# Patient Record
Sex: Female | Born: 1935 | Race: White | Hispanic: No | Smoking: Never smoker
Health system: Southern US, Community
[De-identification: ages and names within clinical notes are randomized; demographics above are authoritative.]

## PROBLEM LIST (undated history)

## (undated) DIAGNOSIS — F419 Anxiety disorder, unspecified: Secondary | ICD-10-CM

## (undated) DIAGNOSIS — I480 Paroxysmal atrial fibrillation: Secondary | ICD-10-CM

## (undated) DIAGNOSIS — B351 Tinea unguium: Secondary | ICD-10-CM

## (undated) DIAGNOSIS — S065XAA Traumatic subdural hemorrhage with loss of consciousness status unknown, initial encounter: Secondary | ICD-10-CM

## (undated) DIAGNOSIS — I10 Essential (primary) hypertension: Secondary | ICD-10-CM

## (undated) DIAGNOSIS — L03119 Cellulitis of unspecified part of limb: Secondary | ICD-10-CM

## (undated) DIAGNOSIS — E785 Hyperlipidemia, unspecified: Secondary | ICD-10-CM

## (undated) DIAGNOSIS — C4490 Unspecified malignant neoplasm of skin, unspecified: Secondary | ICD-10-CM

## (undated) DIAGNOSIS — R55 Syncope and collapse: Secondary | ICD-10-CM

## (undated) DIAGNOSIS — E559 Vitamin D deficiency, unspecified: Secondary | ICD-10-CM

## (undated) DIAGNOSIS — G4700 Insomnia, unspecified: Secondary | ICD-10-CM

## (undated) DIAGNOSIS — K219 Gastro-esophageal reflux disease without esophagitis: Secondary | ICD-10-CM

## (undated) DIAGNOSIS — F329 Major depressive disorder, single episode, unspecified: Secondary | ICD-10-CM

## (undated) DIAGNOSIS — Z9289 Personal history of other medical treatment: Secondary | ICD-10-CM

## (undated) DIAGNOSIS — J45909 Unspecified asthma, uncomplicated: Secondary | ICD-10-CM

## (undated) DIAGNOSIS — J449 Chronic obstructive pulmonary disease, unspecified: Secondary | ICD-10-CM

## (undated) DIAGNOSIS — Z8673 Personal history of transient ischemic attack (TIA), and cerebral infarction without residual deficits: Secondary | ICD-10-CM

## (undated) DIAGNOSIS — F32A Depression, unspecified: Secondary | ICD-10-CM

## (undated) DIAGNOSIS — Z9889 Other specified postprocedural states: Secondary | ICD-10-CM

## (undated) DIAGNOSIS — S065X9A Traumatic subdural hemorrhage with loss of consciousness of unspecified duration, initial encounter: Secondary | ICD-10-CM

## (undated) HISTORY — PX: OTHER SURGICAL HISTORY: SHX169

---

## 2005-11-17 ENCOUNTER — Ambulatory Visit: Payer: Self-pay | Admitting: Internal Medicine

## 2005-11-21 ENCOUNTER — Ambulatory Visit: Payer: Self-pay | Admitting: Internal Medicine

## 2006-09-19 ENCOUNTER — Ambulatory Visit: Payer: Self-pay | Admitting: Internal Medicine

## 2007-01-29 ENCOUNTER — Ambulatory Visit: Payer: Self-pay | Admitting: Internal Medicine

## 2007-02-14 ENCOUNTER — Ambulatory Visit: Payer: Self-pay | Admitting: Internal Medicine

## 2008-02-24 ENCOUNTER — Ambulatory Visit: Payer: Self-pay | Admitting: Internal Medicine

## 2009-10-21 ENCOUNTER — Inpatient Hospital Stay: Payer: Self-pay

## 2012-06-27 ENCOUNTER — Emergency Department: Payer: Self-pay | Admitting: Emergency Medicine

## 2013-04-12 ENCOUNTER — Emergency Department: Payer: Self-pay | Admitting: Emergency Medicine

## 2014-09-29 ENCOUNTER — Other Ambulatory Visit: Payer: Self-pay | Admitting: Internal Medicine

## 2014-09-29 DIAGNOSIS — R1310 Dysphagia, unspecified: Secondary | ICD-10-CM

## 2014-10-07 ENCOUNTER — Ambulatory Visit
Admission: RE | Admit: 2014-10-07 | Discharge: 2014-10-07 | Disposition: A | Discharge status: Home / Self Care | Payer: PPO | Source: Ambulatory Visit | Attending: Internal Medicine | Admitting: Internal Medicine

## 2014-10-07 DIAGNOSIS — R1310 Dysphagia, unspecified: Secondary | ICD-10-CM | POA: Diagnosis present

## 2014-10-07 DIAGNOSIS — K228 Other specified diseases of esophagus: Secondary | ICD-10-CM | POA: Insufficient documentation

## 2014-10-21 ENCOUNTER — Ambulatory Visit: Payer: Self-pay | Admitting: Speech Pathology

## 2014-12-02 ENCOUNTER — Ambulatory Visit: Payer: PPO | Admitting: Physical Therapy

## 2014-12-17 ENCOUNTER — Other Ambulatory Visit: Payer: Self-pay | Admitting: Neurology

## 2014-12-17 DIAGNOSIS — I6200 Nontraumatic subdural hemorrhage, unspecified: Secondary | ICD-10-CM

## 2014-12-17 DIAGNOSIS — I609 Nontraumatic subarachnoid hemorrhage, unspecified: Secondary | ICD-10-CM

## 2014-12-24 ENCOUNTER — Ambulatory Visit
Admission: RE | Admit: 2014-12-24 | Discharge: 2014-12-24 | Disposition: A | Discharge status: Home / Self Care | Payer: PPO | Source: Ambulatory Visit | Attending: Neurology | Admitting: Neurology

## 2014-12-24 DIAGNOSIS — I63312 Cerebral infarction due to thrombosis of left middle cerebral artery: Secondary | ICD-10-CM | POA: Diagnosis not present

## 2014-12-24 DIAGNOSIS — I609 Nontraumatic subarachnoid hemorrhage, unspecified: Secondary | ICD-10-CM

## 2014-12-24 DIAGNOSIS — I6200 Nontraumatic subdural hemorrhage, unspecified: Secondary | ICD-10-CM | POA: Diagnosis present

## 2014-12-24 DIAGNOSIS — J329 Chronic sinusitis, unspecified: Secondary | ICD-10-CM | POA: Insufficient documentation

## 2015-01-02 ENCOUNTER — Ambulatory Visit
Admission: EM | Admit: 2015-01-02 | Discharge: 2015-01-02 | Disposition: A | Discharge status: Home / Self Care | Payer: PPO | Attending: Emergency Medicine | Admitting: Emergency Medicine

## 2015-01-02 DIAGNOSIS — Z8673 Personal history of transient ischemic attack (TIA), and cerebral infarction without residual deficits: Secondary | ICD-10-CM | POA: Diagnosis not present

## 2015-01-02 DIAGNOSIS — N3946 Mixed incontinence: Secondary | ICD-10-CM | POA: Diagnosis not present

## 2015-01-02 DIAGNOSIS — J309 Allergic rhinitis, unspecified: Secondary | ICD-10-CM | POA: Insufficient documentation

## 2015-01-02 DIAGNOSIS — N390 Urinary tract infection, site not specified: Secondary | ICD-10-CM | POA: Insufficient documentation

## 2015-01-02 DIAGNOSIS — Z79899 Other long term (current) drug therapy: Secondary | ICD-10-CM | POA: Insufficient documentation

## 2015-01-02 DIAGNOSIS — N3281 Overactive bladder: Secondary | ICD-10-CM | POA: Insufficient documentation

## 2015-01-02 DIAGNOSIS — J449 Chronic obstructive pulmonary disease, unspecified: Secondary | ICD-10-CM | POA: Insufficient documentation

## 2015-01-02 HISTORY — DX: Traumatic subdural hemorrhage with loss of consciousness status unknown, initial encounter: S06.5XAA

## 2015-01-02 HISTORY — DX: Chronic obstructive pulmonary disease, unspecified: J44.9

## 2015-01-02 HISTORY — DX: Traumatic subdural hemorrhage with loss of consciousness of unspecified duration, initial encounter: S06.5X9A

## 2015-01-02 LAB — URINALYSIS COMPLETE WITH MICROSCOPIC (ARMC ONLY)
Bilirubin Urine: NEGATIVE
Glucose, UA: NEGATIVE mg/dL
Ketones, ur: NEGATIVE mg/dL
Nitrite: POSITIVE — AB
PH: 5.5 (ref 5.0–8.0)
SPECIFIC GRAVITY, URINE: 1.015 (ref 1.005–1.030)

## 2015-01-02 MED ORDER — CEFUROXIME AXETIL 250 MG PO TABS: 250.0000 mg | ORAL_TABLET | Freq: Two times a day (BID) | ORAL | Status: DC

## 2015-01-02 MED ORDER — PHENAZOPYRIDINE HCL 100 MG PO TABS: 200.0000 mg | ORAL_TABLET | Freq: Three times a day (TID) | ORAL | Status: DC | PRN

## 2015-01-02 NOTE — Discharge Instructions (Signed)
Overactive Bladder The bladder has two functions that are totally opposite of the other. One is to relax and stretch out so it can store urine (fills like a balloon), and the other is to contract and squeeze down so that it can empty the urine that it has stored. Proper functioning of the bladder is a complex mixing of these two functions. The filling and emptying of the bladder can be influenced by:  The bladder.  The spinal cord.  The brain.  The nerves going to the bladder.  Other organs that are closely related to the bladder such as prostate in males and the vagina in females. As your bladder fills with urine, nerve signals are sent from the bladder to the brain to tell you that you may need to urinate. Normal urination requires that the bladder squeeze down with sufficient strength to empty the bladder, but this also requires that the bladder squeeze down sufficiently long to finish the job. In addition the sphincter muscles, which normally keep you from leaking urine, must also relax so that the urine can pass. Coordination between the bladder muscle squeezing down and the sphincter muscles relaxing is required to make everything happen normally. With an overactive bladder sometimes the muscles of the bladder contract unexpectedly and involuntarily and this causes an urgent need to urinate. The normal response is to try to hold urine in by contracting the sphincter muscles. Sometimes the bladder contracts so strongly that the sphincter muscles cannot stop the urine from passing out and incontinence occurs. This kind of incontinence is called urge incontinence. Having an overactive bladder can be embarrassing and awkward. It can keep you from living life the way you want to. Many people think it is just something you have to put up with as you grow older or have certain health conditions. In fact, there are treatments that can help make your life easier and more pleasant. CAUSES  Many things  can cause an overactive bladder. Possibilities include:  Urinary tract infection or infection of nearby tissues such as the prostate.  Prostate enlargement.  In women, multiple pregnancies or surgery on the uterus or urethra.  Bladder stones, inflammation, or tumors.  Caffeine.  Alcohol.  Medications. For example, diuretics (drugs that help the body get rid of extra fluid) increase urine production. Some other medicines must be taken with lots of fluids.  Muscle or nerve weakness. This might be the result of a spinal cord injury, a stroke, multiple sclerosis, or Parkinson disease.  Diabetes can cause a high urine volume which fills the bladder so quickly that the normal urge to urinate is triggered very strongly. SYMPTOMS   Loss of bladder control. You feel the need to urinate and cannot make your body wait.  Sudden, strong urges to urinate.  Urinating 8 or more times a day.  Waking up to urinate two or more times a night. DIAGNOSIS  To decide if you have overactive bladder, your health care provider will probably:  Ask about symptoms you have noticed.  Ask about your overall health. This will include questions about any medications you are taking.  Do a physical examination. This will help determine if there are obvious blockages or other problems.  Order some tests. These might include:  A blood test to check for diabetes or other health issues that could be contributing to the problem.  Urine testing. This could measure the flow of urine and the pressure on the bladder.  A test of your neurological  system (the brain, spinal cord, and nerves). This is the system that senses the need to urinate. Some of these tests are called flow tests, bladder pressure tests, and electrical measurements of the sphincter muscle.  A bladder test to check whether it is emptying completely when you urinate.  Cystoscopy. This test uses a thin tube with a tiny camera on it. It offers a  look inside your urethra and bladder to see if there are problems.  Imaging tests. You might be given a contrast dye and then asked to urinate. X-rays are taken to see how your bladder is working. TREATMENT  An overactive bladder can be treated in many ways. The treatment will depend on the cause. Whether you have a mild or severe case also makes a difference. Often, treatment can be given in your health care provider's office or clinic. Be sure to discuss the different options with your caregiver. They include:  Behavioral treatments. These do not involve medication or surgery:  Bladder training. For this, you would follow a schedule to urinate at regular intervals. This helps you learn to control the urge to urinate. At first, you might be asked to wait a few minutes after feeling the urge. In time, you should be able to schedule bathroom visits an hour or more apart.  Kegel exercises. These exercises strengthen the pelvic floor muscles, which support the bladder. Toning these muscles can help control urination even if the bladder muscles are overactive. A specialist will teach you how to do these exercises correctly. They will require daily practice.  Weight loss. If you are obese or overweight, losing weight might stop your bladder from being overactive. Talk to your health care provider about how many pounds you should lose. Also ask if there is a specific program or method that would work best for you.  Diet change. This might be suggested if constipation is making your overactive bladder worse. Your health care provider or a nutritionist can explain ways to change what you eat to ease constipation. Other people might need to take in less caffeine or alcohol. Sometimes drinking fewer fluids is needed, too.  Protection. This is not an actual treatment. But, you could wear special pads to take care of any leakage while you wait for other treatments to take effect. This will help you avoid  embarrassment.  Physical treatments.  Electrical stimulation. Electrodes will send gentle pulses to the nerves or muscles that help control the bladder. The goal is to strengthen them. Sometimes this is done with the electrodes outside the body. Or, they might be placed inside the body (implanted). This treatment can take several months to have an effect.  Medications. These are usually used along with other treatments. Several medicines are available. Some are injected into the muscles involved in urination. Others come in pill form. Medications sometimes prescribed include:  Anticholinergics. These drugs block the signals that the nerves deliver to the bladder. This keeps it from releasing urine at the wrong time. Researchers think the drugs might help in other ways, too.  Imipramine. This is an antidepressant. But, it relaxes bladder muscles.  Botox. This is still experimental. Some people believe that injecting it into the bladder muscles will relax them so they work more normally. It has also been injected into the sphincter muscle when the sphincter muscle does not open properly. This is a temporary fix, however. Also, it might make matters worse, especially in older people.  Surgery.  A device might be implanted  to help manage your nerves. It works on the nerves that signal when you need to urinate.  Surgery is sometimes needed with electrical stimulation. If the electrodes are implanted, this is done through surgery.  Sometimes repairs need to be made through surgery. For example, the size of the bladder can be changed. This is usually done in severe cases only. HOME CARE INSTRUCTIONS   Take any medications your health care provider prescribed or suggested. Follow the directions carefully.  Practice any lifestyle changes that are recommended. These might include:  Drinking less fluid or drinking at different times of the day. If you need to urinate often during the night, for  example, you may need to stop drinking fluids early in the evening.  Cutting down on caffeine or alcohol. They can both make an overactive bladder worse. Caffeine is found in coffee, tea, and sodas.  Doing Kegel exercises to strengthen muscles.  Losing weight, if that is recommended.  Eating a healthy and balanced diet. This will help you avoid constipation.  Keep a journal or a log. You might be asked to record how much you drink and when, and also when you feel the need to urinate.  Learn how to care for implants or other devices, such as pessaries. SEEK MEDICAL CARE IF:   Your overactive bladder gets worse.  You feel increased pain or irritation when you urinate.  You notice blood in your urine.  You have questions about any medications or devices that your health care provider recommended.  You notice blood, pus, or swelling at the site of any test or treatment procedure.  You have an oral temperature above 102F (38.9C). SEEK IMMEDIATE MEDICAL CARE IF:  You have an oral temperature above 102F (38.9C), not controlled by medicine. Document Released: 03/18/2009 Document Revised: 10/06/2013 Document Reviewed: 03/18/2009 [facility] Patient Information [address], [place]. This information is not intended to replace advice given to you by your health care provider. Make sure you discuss any questions you have with your health care provider. Kegel Exercises The goal of Kegel exercises is to isolate and exercise your pelvic floor muscles. These muscles act as a hammock that supports the rectum, vagina, small intestine, and uterus. As the muscles weaken, the hammock sags and these organs are displaced from their normal positions. Kegel exercises can strengthen your pelvic floor muscles and help you to improve bladder and bowel control, improve sexual response, and help reduce many problems and some discomfort during pregnancy. Kegel exercises can be done anywhere and at any time. HOW TO  PERFORM Summers your pelvic floor muscles. To do this, squeeze (contract) the muscles that you use when you try to stop the flow of urine. You will feel a tightness in the vaginal area (women) and a tight lift in the rectal area (men and women).  When you begin, contract your pelvic muscles tight for 2-5 seconds, then relax them for 2-5 seconds. This is one set. Do 4-5 sets with a short pause in between.  Contract your pelvic muscles for 8-10 seconds, then relax them for 8-10 seconds. Do 4-5 sets. If you cannot contract your pelvic muscles for 8-10 seconds, try 5-7 seconds and work your way up to 8-10 seconds. Your goal is 4-5 sets of 10 contractions each day. Keep your stomach, buttocks, and legs relaxed during the exercises. Perform sets of both short and long contractions. Vary your positions. Perform these contractions 3-4 times per day. Perform sets while you are:  Lying in bed in the morning.  Standing at lunch.  Sitting in the late afternoon.  Lying in bed at night. You should do 40-50 contractions per day. Do not perform more Kegel exercises per day than recommended. Overexercising can cause muscle fatigue. Continue these exercises for for at least 15-20 weeks or as directed by your caregiver. Document Released: 05/08/2012 Document Reviewed: 05/08/2012 [facility] Patient Information 2015 [place]. This information is not intended to replace advice given to you by your health care provider. Make sure you discuss any questions you have with your health care provider. Urinary Incontinence Urinary incontinence is the involuntary loss of urine from your bladder. CAUSES  There are many causes of urinary incontinence. They include:  Medicines.  Infections.  Prostatic enlargement, leading to overflow of urine from your bladder.  Surgery.  Neurological diseases.  Emotional factors. SIGNS AND SYMPTOMS Urinary Incontinence can be divided into four  types:  Urge incontinence. Urge incontinence is the involuntary loss of urine before you have the opportunity to go to the bathroom. There is a sudden urge to void but not enough time to reach a bathroom.  Stress incontinence. Stress incontinence is the sudden loss of urine with any activity that forces urine to pass. It is commonly caused by anatomical changes to the pelvis and sphincter areas of your body.  Overflow incontinence. Overflow incontinence is the loss of urine from an obstructed opening to your bladder. This results in a backup of urine and a resultant buildup of pressure within the bladder. When the pressure within the bladder exceeds the closing pressure of the sphincter, the urine overflows, which causes incontinence, similar to water overflowing a dam.  Total incontinence. Total incontinence is the loss of urine as a result of the inability to store urine within your bladder. DIAGNOSIS  Evaluating the cause of incontinence may require:  A thorough and complete medical and obstetric history.  A complete physical exam.  Laboratory tests such as a urine culture and sensitivities. When additional tests are indicated, they can include:  An ultrasound exam.  Kidney and bladder X-rays.  Cystoscopy. This is an exam of the bladder using a narrow scope.  Urodynamic testing to test the nerve function to the bladder and sphincter areas. TREATMENT  Treatment for urinary incontinence depends on the cause:  For urge incontinence caused by a bacterial infection, antibiotics will be prescribed. If the urge incontinence is related to medicines you take, your health care provider may have you change the medicine.  For stress incontinence, surgery to re-establish anatomical support to the bladder or sphincter, or both, will often correct the condition.  For overflow incontinence caused by an enlarged prostate, an operation to open the channel through the enlarged prostate will allow  the flow of urine out of the bladder. In women with fibroids, a hysterectomy may be recommended.  For total incontinence, surgery on your urinary sphincter may help. An artificial urinary sphincter (an inflatable cuff placed around the urethra) may be required. In women who have developed a hole-like passage between their bladder and vagina (vesicovaginal fistula), surgery to close the fistula often is required. HOME CARE INSTRUCTIONS  Normal daily hygiene and the use of pads or adult diapers that are changed regularly will help prevent odors and skin damage.  Avoid caffeine. It can overstimulate your bladder.  Use the bathroom regularly. Try about every 2-3 hours to go to the bathroom, even if you do not feel the need to do so. Take time  to empty your bladder completely. After urinating, wait a minute. Then try to urinate again.  For causes involving nerve dysfunction, keep a log of the medicines you take and a journal of the times you go to the bathroom. SEEK MEDICAL CARE IF:  You experience worsening of pain instead of improvement in pain after your procedure.  Your incontinence becomes worse instead of better. SEE IMMEDIATE MEDICAL CARE IF:  You experience fever or shaking chills.  You are unable to pass your urine.  You have redness spreading into your groin or down into your thighs. MAKE SURE YOU:   Understand these instructions.   Will watch your condition.  Will get help right away if you are not doing well or get worse. Document Released: 06/29/2004 Document Revised: 03/12/2013 Document Reviewed: 10/29/2012 [facility] Patient Information 2015 [place], [place]. This information is not intended to replace advice given to you by your health care provider. Make sure you discuss any questions you have with your health care provider. Urinary Tract Infection Urinary tract infections (UTIs) can develop anywhere along your urinary tract. Your urinary tract is your body's drainage  system for removing wastes and extra water. Your urinary tract includes two kidneys, two ureters, a bladder, and a urethra. Your kidneys are a pair of bean-shaped organs. Each kidney is about the size of your fist. They are located below your ribs, one on each side of your spine. CAUSES Infections are caused by microbes, which are microscopic organisms, including fungi, viruses, and bacteria. These organisms are so small that they can only be seen through a microscope. Bacteria are the microbes that most commonly cause UTIs. SYMPTOMS  Symptoms of UTIs may vary by age and gender of the patient and by the location of the infection. Symptoms in young women typically include a frequent and intense urge to urinate and a painful, burning feeling in the bladder or urethra during urination. Older women and men are more likely to be tired, shaky, and weak and have muscle aches and abdominal pain. A fever may mean the infection is in your kidneys. Other symptoms of a kidney infection include pain in your back or sides below the ribs, nausea, and vomiting. DIAGNOSIS To diagnose a UTI, your caregiver will ask you about your symptoms. Your caregiver also will ask to provide a urine sample. The urine sample will be tested for bacteria and white blood cells. White blood cells are made by your body to help fight infection. TREATMENT  Typically, UTIs can be treated with medication. Because most UTIs are caused by a bacterial infection, they usually can be treated with the use of antibiotics. The choice of antibiotic and length of treatment depend on your symptoms and the type of bacteria causing your infection. HOME CARE INSTRUCTIONS  If you were prescribed antibiotics, take them exactly as your caregiver instructs you. Finish the medication even if you feel better after you have only taken some of the medication.  Drink enough water and fluids to keep your urine clear or pale yellow.  Avoid caffeine, tea, and  carbonated beverages. They tend to irritate your bladder.  Empty your bladder often. Avoid holding urine for long periods of time.  Empty your bladder before and after sexual intercourse.  After a bowel movement, women should cleanse from front to back. Use each tissue only once. SEEK MEDICAL CARE IF:   You have back pain.  You develop a fever.  Your symptoms do not begin to resolve within 3 days. SEEK  IMMEDIATE MEDICAL CARE IF:   You have severe back pain or lower abdominal pain.  You develop chills.  You have nausea or vomiting.  You have continued burning or discomfort with urination. MAKE SURE YOU:   Understand these instructions.  Will watch your condition.  Will get help right away if you are not doing well or get worse. Document Released: 03/01/2005 Document Revised: 11/21/2011 Document Reviewed: 06/30/2011 [facility] Patient Information 2015 [place], [place]. This information is not intended to replace advice given to you by your health care provider. Make sure you discuss any questions you have with your health care provider.

## 2015-01-02 NOTE — ED Provider Notes (Addendum)
CSN: [id number]     Arrival date & time 01/02/15  1417 History   First MD Initiated Contact with Patient 01/02/15 1440     Chief Complaint  Patient presents with  . Urinary Tract Infection   (Consider location/radiation/quality/duration/timing/severity/associated sxs/prior Treatment) HPI Comments: Single caucasian female elderly here with two daughters for evaluation of urinary frequency, urgency, incontinence, burning, slight confusion (mental) repeating self or unsure if took medication today, decreased amounts of urine, cloudy and has odor.  Patient has been wearing urinary pads or diapers since stroke 11 Aug 2014.  Fell at home and suffered subdural hematoma and subarachnoid bleed after stroke and had follow up head CT scan with PCM recently also normal no active bleeding.  Patient has some dysphasia since stroke unable to always say word she wants to e.g. Dog's name but can describe her, etc.  Her oxybutynin was stopped after her stroke and urinary frequency worsened.  Daughters report mother doesn't always change diaper or pad immediately if soiled due to frugalness "grew up during depression era".  Voiding every 2 hours at night past 2-3 days.  Patient uses walker and currently living with a daughter.  Seasonal allergies acting up too recently restarted on claritin 10mg  po daily.  Daughters noticed cough nonproductive worst at night.  The history is provided by the patient, a relative and a caregiver.    Past Medical History  Diagnosis Date  . Stroke   . COPD (chronic obstructive pulmonary disease)   . Subdural hematoma    History reviewed. No pertinent past surgical history. History reviewed. No pertinent family history. History  Substance Use Topics  . Smoking status: Never Smoker   . Smokeless tobacco: Never Used  . Alcohol Use: Yes     Comment: occasional   OB History    No data available     Review of Systems  Constitutional: Negative for fever, chills, diaphoresis,  activity change, appetite change, fatigue and unexpected weight change.  HENT: Positive for congestion, postnasal drip and rhinorrhea. Negative for dental problem, drooling, ear discharge, ear pain, facial swelling, hearing loss, mouth sores, nosebleeds, sinus pressure, sore throat, tinnitus, trouble swallowing and voice change.   Eyes: Negative for photophobia, pain, discharge, redness, itching and visual disturbance.  Respiratory: Positive for cough. Negative for choking, chest tightness, shortness of breath, wheezing and stridor.   Cardiovascular: Negative for chest pain, palpitations and leg swelling.  Gastrointestinal: Negative for nausea, vomiting, abdominal pain, diarrhea, constipation, blood in stool and abdominal distention.  Endocrine: Negative for cold intolerance and heat intolerance.  Genitourinary: Positive for dysuria, urgency, frequency and decreased urine volume. Negative for hematuria, flank pain, vaginal bleeding, vaginal discharge, enuresis, difficulty urinating, genital sores, vaginal pain, menstrual problem, pelvic pain and dyspareunia.  Musculoskeletal: Negative for myalgias, back pain, joint swelling, arthralgias, gait problem, neck pain and neck stiffness.  Allergic/Immunologic: Positive for environmental allergies. Negative for food allergies.  Neurological: Positive for speech difficulty. Negative for dizziness, tremors, seizures, syncope, facial asymmetry, weakness, light-headedness, numbness and headaches.  Hematological: Negative for adenopathy. Does not bruise/bleed easily.  Psychiatric/Behavioral: Positive for confusion and sleep disturbance. Negative for hallucinations, behavioral problems, self-injury and agitation. The patient is not nervous/anxious and is not hyperactive.     Allergies  Review of patient's allergies indicates no known allergies.  Home Medications   Prior to Admission medications   Medication Sig Start Date End Date Taking? Authorizing Provider   ALPRAZolam Duanne Moron) 0.25 MG tablet Take 0.25 mg by mouth at bedtime as  needed for anxiety.   Yes Historical Provider, MD  atorvastatin (LIPITOR) 40 MG tablet Take 40 mg by mouth daily.   Yes Historical Provider, MD  loratadine (CLARITIN) 10 MG tablet Take 10 mg by mouth daily.   Yes Historical Provider, MD  metoprolol succinate (TOPROL-XL) 25 MG 24 hr tablet Take 25 mg by mouth daily.   Yes Historical Provider, MD  omeprazole (PRILOSEC) 20 MG capsule Take 20 mg by mouth daily.   Yes Historical Provider, MD  prednisoLONE 5 MG TABS tablet Take by mouth.   Yes Historical Provider, MD  cefUROXime (CEFTIN) 250 MG tablet Take 1 tablet (250 mg total) by mouth 2 (two) times daily with a meal. 01/02/15   Olen Cordial, NP  phenazopyridine (PYRIDIUM) 100 MG tablet Take 2 tablets (200 mg total) by mouth 3 (three) times daily as needed for pain. 01/02/15   Aura Fey Vanshika Jastrzebski, NP   BP 128/57 mmHg  Pulse 89  Temp(Src) 97 F (36.1 C) (Tympanic)  Ht 5\' 2"  (1.575 m)  Wt 98 lb (44.453 kg)  BMI 17.92 kg/m2  SpO2 100% Physical Exam  Constitutional: She is oriented to person, place, and time. Vital signs are normal. She appears well-developed and well-nourished. No distress.  HENT:  Head: Normocephalic and atraumatic.  Right Ear: Hearing, tympanic membrane, external ear and ear canal normal.  Left Ear: Hearing, tympanic membrane, external ear and ear canal normal.  Nose: Mucosal edema present. No rhinorrhea, nose lacerations, sinus tenderness or nasal deformity. No epistaxis.  No foreign bodies. Right sinus exhibits no maxillary sinus tenderness and no frontal sinus tenderness. Left sinus exhibits no maxillary sinus tenderness and no frontal sinus tenderness.  Mouth/Throat: Uvula is midline and mucous membranes are normal. Mucous membranes are not pale, not dry and not cyanotic. She does not have dentures. No oral lesions. No trismus in the jaw. Normal dentition. No dental abscesses, uvula swelling, lacerations  or dental caries. Posterior oropharyngeal edema and posterior oropharyngeal erythema present. No oropharyngeal exudate or tonsillar abscesses.  Yellow white cerumen occluding bilateral external canals unable to visualize TMs; cobblestoning posterior pharynx  Eyes: Conjunctivae, EOM and lids are normal. Pupils are equal, round, and reactive to light. Right eye exhibits no discharge. Left eye exhibits no discharge. No scleral icterus.  Neck: Trachea normal and normal range of motion. Neck supple. No tracheal deviation present. No thyromegaly present.  Cardiovascular: Normal rate, regular rhythm, normal heart sounds and intact distal pulses.  Exam reveals no gallop and no friction rub.   No murmur heard. Pulmonary/Chest: Effort normal and breath sounds normal. No respiratory distress. She has no wheezes. She has no rales. She exhibits no tenderness.  Abdominal: Soft. Bowel sounds are normal. She exhibits no shifting dullness, no distension, no pulsatile liver, no fluid wave, no abdominal bruit, no ascites, no pulsatile midline mass and no mass. There is no hepatosplenomegaly. There is no tenderness. There is no rigidity, no rebound, no guarding, no CVA tenderness, no tenderness at McBurney's point and negative Murphy's sign. No hernia. Hernia confirmed negative in the ventral area.  Dull to percussion x 4 quads  Musculoskeletal: Normal range of motion. She exhibits no edema or tenderness.  Lymphadenopathy:    She has no cervical adenopathy.  Neurological: She is alert and oriented to person, place, and time. She has normal reflexes. She exhibits normal muscle tone. Coordination normal.  Skin: Skin is warm, dry and intact. No rash noted. She is not diaphoretic. No erythema. No pallor.  Psychiatric:  She has a normal mood and affect. Her speech is normal and behavior is normal. Judgment and thought content normal. Cognition and memory are normal.  Nursing note and vitals reviewed.  Patient gait slow and  steady in exam room and hall with walker to bathroom and return with daughter walking next to her.  Speech clear, answers appropriate but looks to daughters for validation of medication reconciliation and date/time last dose taken.  ED Course  Procedures (including critical care time) Labs Review Labs Reviewed  URINALYSIS COMPLETEWITH MICROSCOPIC ([facility] ONLY) - Abnormal; Notable for the following:    APPearance CLOUDY (*)    Hgb urine dipstick TRACE (*)    Protein, ur TRACE (*)    Nitrite POSITIVE (*)    Leukocytes, UA 2+ (*)    Bacteria, UA MANY (*)    Squamous Epithelial / LPF 0-5 (*)    All other components within normal limits  URINE CULTURE    Imaging Review No results found.   MDM   1. UTI (lower urinary tract infection)   2. Mixed incontinence   3. Overactive bladder   4. History of stroke   5. Allergic rhinitis, unspecified allergic rhinitis type    Medications as directed.  Patient is also to push fluids and may use Pyridium 100mg  po TID prn x 2 days.  Follow up with [facility] for re-evaluation of incontinence and overactive bladder.  Trial kegel exercises handout given to patient.  Discussed voiding on regular schedule/bladder training e.g. Hourly and increase to every 2 hours while awake.  Do not wear wet urinary pads or diapers as can cause recurrent UTIs.  This is her second this year.  Previous urine culture resistant to floxins, bactrim.  See epic care everywhere.  Culture pending in 48 hours. Given copy of urinalysis results today and discussed with daughters and patient.  Avoiding macrobid in geriatric patient.  Has previously tolerated ceftin 250mg  po BID x 7 days.  Discussed prilosec can decrease efficacy of ceftin hold her prilosec dose this week unless heartburn symptoms and discussed may need to complete longer course of ceftin 10 days given.  Discussed oxybutynin not recommended with dementia/stroke and why PCM probably discontinued medication.  If no improvement with  medications over next 48 hours contact me to discuss changing antibiotics.  If hematuria gross, back/flank pain, confusion worsening, lethargy, fever, unable to void every 8 hours follow up sooner at ER as patient may require IV antibiotics or further imaging/tests.   Call or return to clinic as needed if these symptoms worsen or fail to improve as anticipated.  Exitcare handout on cystitis, overactive bladder, kegels exercises, urinary incontinence given to patient and daughters.  Daughters and Patient verbalized agreement and understanding of treatment plan and had no further questions at this time. P2:  Hydrate and cranberry juice  Patient may use normal saline nasal spray as needed.  Continue claritin 10mg  po daily. On prednisone daily discussed would not start flonase or nasacort because of this.  Avoid triggers if possible.  Shower prior to bedtime if exposed to triggers.  If allergic dust/dust mites recommend mattress/pillow covers/encasements; washing linens, vacuuming, sweeping, dusting weekly.  Call or return to clinic as needed if these symptoms worsen or fail to improve as anticipated.   Exitcare handout on allergic rhinitis given to patient.  Patient and daughters verbalized understanding of instructions, agreed with plan of care and had no further questions at this time.  P2:  Avoidance and hand washing.  Otila Kluver  Erling Conte, NP 01/03/15 1501  Spoke with brother in law.  Patient doing better on medication symptoms decreased.  Discussed urine culture e.coli sensitive to ceftin.  Patient to take all medication as prescribed and follow up if symptoms do not completely resolve with PCM or [place].  Family member verbalized understanding of information/instructions, agreed with plan of care and had no further questions at this time.  Olen Cordial, NP 01/08/15 [phone]

## 2015-01-02 NOTE — ED Notes (Signed)
UTI symptoms last 2-3 days. Having more urgency and burning.

## 2015-01-06 LAB — URINE CULTURE: Culture: >=100000

## 2015-04-04 ENCOUNTER — Emergency Department: Payer: PPO

## 2015-04-04 ENCOUNTER — Encounter: Payer: Self-pay | Admitting: Emergency Medicine

## 2015-04-04 ENCOUNTER — Observation Stay
Admission: EM | Admit: 2015-04-04 | Discharge: 2015-04-06 | Disposition: A | Discharge status: Skilled Nursing Facility | Payer: PPO | Attending: Internal Medicine | Admitting: Internal Medicine

## 2015-04-04 DIAGNOSIS — S32592A Other specified fracture of left pubis, initial encounter for closed fracture: Secondary | ICD-10-CM | POA: Diagnosis present

## 2015-04-04 DIAGNOSIS — M25552 Pain in left hip: Secondary | ICD-10-CM | POA: Diagnosis not present

## 2015-04-04 DIAGNOSIS — Z79899 Other long term (current) drug therapy: Secondary | ICD-10-CM | POA: Insufficient documentation

## 2015-04-04 DIAGNOSIS — A419 Sepsis, unspecified organism: Secondary | ICD-10-CM | POA: Diagnosis not present

## 2015-04-04 DIAGNOSIS — S51811A Laceration without foreign body of right forearm, initial encounter: Secondary | ICD-10-CM

## 2015-04-04 DIAGNOSIS — R531 Weakness: Secondary | ICD-10-CM | POA: Insufficient documentation

## 2015-04-04 DIAGNOSIS — M8580 Other specified disorders of bone density and structure, unspecified site: Secondary | ICD-10-CM | POA: Diagnosis not present

## 2015-04-04 DIAGNOSIS — Z888 Allergy status to other drugs, medicaments and biological substances status: Secondary | ICD-10-CM | POA: Diagnosis not present

## 2015-04-04 DIAGNOSIS — J449 Chronic obstructive pulmonary disease, unspecified: Secondary | ICD-10-CM | POA: Insufficient documentation

## 2015-04-04 DIAGNOSIS — N390 Urinary tract infection, site not specified: Secondary | ICD-10-CM | POA: Diagnosis not present

## 2015-04-04 DIAGNOSIS — K5790 Diverticulosis of intestine, part unspecified, without perforation or abscess without bleeding: Secondary | ICD-10-CM | POA: Insufficient documentation

## 2015-04-04 DIAGNOSIS — Z8679 Personal history of other diseases of the circulatory system: Secondary | ICD-10-CM | POA: Diagnosis not present

## 2015-04-04 DIAGNOSIS — M5136 Other intervertebral disc degeneration, lumbar region: Secondary | ICD-10-CM | POA: Diagnosis not present

## 2015-04-04 DIAGNOSIS — Z8673 Personal history of transient ischemic attack (TIA), and cerebral infarction without residual deficits: Secondary | ICD-10-CM | POA: Insufficient documentation

## 2015-04-04 DIAGNOSIS — S81811A Laceration without foreign body, right lower leg, initial encounter: Principal | ICD-10-CM | POA: Insufficient documentation

## 2015-04-04 DIAGNOSIS — W19XXXA Unspecified fall, initial encounter: Secondary | ICD-10-CM | POA: Diagnosis not present

## 2015-04-04 DIAGNOSIS — Z8249 Family history of ischemic heart disease and other diseases of the circulatory system: Secondary | ICD-10-CM | POA: Diagnosis not present

## 2015-04-04 DIAGNOSIS — S51801A Unspecified open wound of right forearm, initial encounter: Secondary | ICD-10-CM | POA: Diagnosis present

## 2015-04-04 DIAGNOSIS — R000 Tachycardia, unspecified: Secondary | ICD-10-CM | POA: Insufficient documentation

## 2015-04-04 DIAGNOSIS — S32502A Unspecified fracture of left pubis, initial encounter for closed fracture: Secondary | ICD-10-CM | POA: Insufficient documentation

## 2015-04-04 DIAGNOSIS — Z833 Family history of diabetes mellitus: Secondary | ICD-10-CM | POA: Diagnosis not present

## 2015-04-04 MED ORDER — TETANUS-DIPHTH-ACELL PERTUSSIS 5-2.5-18.5 LF-MCG/0.5 IM SUSP
0.5000 mL | Freq: Once | INTRAMUSCULAR | Status: AC
  Administered 2015-04-04: 0.5 mL via INTRAMUSCULAR
  Filled 2015-04-04: qty 0.5

## 2015-04-04 MED ORDER — MORPHINE SULFATE (PF) 2 MG/ML IV SOLN
2.0000 mg | Freq: Once | INTRAVENOUS | Status: DC
  Filled 2015-04-04: qty 1

## 2015-04-04 MED ORDER — LIDOCAINE HCL (PF) 1 % IJ SOLN
INTRAMUSCULAR | Status: AC
  Filled 2015-04-04: qty 10

## 2015-04-04 MED ORDER — ONDANSETRON HCL 4 MG/2ML IJ SOLN
4.0000 mg | Freq: Once | INTRAMUSCULAR | Status: AC
  Administered 2015-04-05: 4 mg via INTRAVENOUS
  Filled 2015-04-04: qty 2

## 2015-04-04 MED ORDER — TRAMADOL HCL 50 MG PO TABS: ORAL_TABLET | ORAL | Status: DC

## 2015-04-04 MED ORDER — ACETAMINOPHEN 325 MG PO TABS: 650.0000 mg | ORAL_TABLET | Freq: Once | ORAL | Status: DC

## 2015-04-04 MED ORDER — TRAMADOL HCL 50 MG PO TABS
100.0000 mg | ORAL_TABLET | Freq: Once | ORAL | Status: AC
  Administered 2015-04-04: 100 mg via ORAL
  Filled 2015-04-04: qty 2

## 2015-04-04 MED ORDER — DOCUSATE SODIUM 100 MG PO CAPS: ORAL_CAPSULE | ORAL | Status: DC

## 2015-04-04 MED ORDER — SODIUM CHLORIDE 0.9 % IV BOLUS (SEPSIS)
1000.0000 mL | Freq: Once | INTRAVENOUS | Status: AC
  Administered 2015-04-05: 1000 mL via INTRAVENOUS

## 2015-04-04 NOTE — Discharge Instructions (Signed)
You have been seen in the Emergency Department (ED) today for a skin tear to your right forearm and right lower leg.  Please try to avoid getting the Steri-Strips on your right arm when it and they will follow off on their own in about a week.  Similarly, please try to avoid getting the Steri-Strips on your right lower leg wet.  It is important that they stay in place until they fall off on their own.  In addition, as we discussed, you have subtle fractures of your pelvic from your fall.  These injuries are managed non-operatively with pain medication.  Please call Dr. Nicholaus Bloom office in the morning to set up a follow up appointment for about one week.  Please take Tylenol (acetaminophen) or Motrin (ibuprofen) as needed for discomfort as written on the box.   Please follow up with your doctor as soon as possible regarding today's emergent visit.   Return to the ED for re-evaluation or call your doctor if you notice any signs of infection such as fever, increased pain, increased redness, pus, or other symptoms that concern you.   Deep Skin Avulsion A deep skin avulsion is a type of open wound. It often results from a severe injury (trauma) that tears away all layers of the skin or an entire body part. The areas of the body that are most often affected by a deep skin avulsion include the face, lips, ears, nose, and fingers. A deep skin avulsion may make structures below the skin become visible. You may be able to see muscle, bone, nerves, and blood vessels. A deep skin avulsion can also damage important structures beneath the skin. These include tendons, ligaments, nerves, or blood vessels. CAUSES Injuries that often cause a deep skin avulsion include:  Being crushed.  Falling against a jagged surface.  Animal bites.  Gunshot wounds.  Severe burns.  Injuries that involve being dragged, such as bicycle or motorcycle accidents. SYMPTOMS Symptoms of a deep skin avulsion  include:  Pain.  Numbness.  Swelling.  A misshapen body part.  Bleeding, which may be heavy.  Fluid leaking from the wound. DIAGNOSIS This condition may be diagnosed with a medical history and physical exam. You may also have X-rays done. TREATMENT The treatment that is chosen for a deep skin avulsion depends on how large and deep the wound is and where it is located. Treatment for all types of avulsions usually starts with:  Controlling the bleeding.  Washing out the wound with a germ-free (sterile) salt-water solution.  Removing dead tissue from the wound. A wound may be closed or left open to heal. This depends on the size and location of the wound and whether it is likely to become infected. Wounds are usually covered or closed if they expose blood vessels, nerves, bone, or cartilage.  Wounds that are small and clean may be closed with stitches (sutures).  Wounds that cannot be closed with sutures may be covered with a piece of skin (graft) or a skin flap. Skin may be taken from on or near the wound, from another part of the body, or from a donor.  Wounds may be left open if they are hard to close or they may become infected. These wounds heal over time from the bottom up. You may also receive medicine. This may include:  Antibiotics.  A tetanus shot.  Rabies vaccine. HOME CARE INSTRUCTIONS Medicines  Take or apply over-the-counter and prescription medicines only as told by your health care provider.  If you were prescribed an antibiotic, take or apply it as told by your health care provider. Do not stop taking the antibiotic even if your condition improves.  You may get anti-itch medicine while your wound is healing. Use it only as told by your health care provider. Wound Care  There are many ways to close and cover a wound. For example, a wound can be covered with sutures, skin glue, or adhesive strips. Follow instructions from your health care provider  about:  How to take care of your wound.  When and how you should change your bandage (dressing).  When you should remove your dressing.  Removing whatever was used to close your wound.  Keep the dressing dry as told by your health care provider. Do not take baths, swim, use a hot tub, or do anything that would put your wound underwater until your health care provider approves.  Clean the wound each day or as told by your health care provider.  Wash the wound with mild soap and water.  Rinse the wound with water to remove all soap.  Pat the wound dry with a clean towel. Do not rub it.  Do not scratch or pick at the wound.  Check your wound every day for signs of infection. Watch for:  Redness, swelling, or pain.  Fluid, blood, or pus. General Instructions  Raise (elevate) the injured area above the level of your heart while you are sitting or lying down.  Keep all follow-up visits as told by your health care provider. This is important. SEEK MEDICAL CARE IF:  You received a tetanus shot and you have swelling, severe pain, redness, or bleeding at the injection site.  You have a fever.  Your pain is not controlled with medicine.  You have increased redness, swelling, or pain at the site of your wound.  You have fluid, blood, or pus coming from your wound.  You notice a bad smell coming from your wound or your dressing.  A wound that was closed breaks open.  You notice something coming out of the wound, such as wood or glass.  You notice a change in the color of your skin near your wound.  You develop a new rash.  You need to change the dressing frequently due to fluid, blood, or pus draining from the wound. SEEK IMMEDIATE MEDICAL CARE IF:  Your pain suddenly increases and is severe.  You develop severe swelling around the wound.  You develop numbness around the wound.  You have nausea and vomiting that does not go away after 24 hours.  You feel  light-headed, weak, or faint.  You develop chest pain.  You have trouble breathing.  Your wound is on your hand or foot and you cannot properly move a finger or toe.  The wound is on your hand or foot and you notice that your fingers or toes look pale or bluish.  You have a red streak going away from your wound.   This information is not intended to replace advice given to you by your health care provider. Make sure you discuss any questions you have with your health care provider.   Document Released: 07/18/2006 Document Revised: 10/06/2014 Document Reviewed: 05/27/2014 Elsevier Interactive Patient Education 2016 [place], Adult A laceration is a cut that goes through all of the layers of the skin and into the tissue that is right under the skin. Some lacerations heal on their own. Others need to  be closed with stitches (sutures), staples, skin adhesive strips, or skin glue. Proper laceration care minimizes the risk of infection and helps the laceration to heal better. HOW TO CARE FOR YOUR LACERATION If sutures or staples were used:  Keep the wound clean and dry.  If you were given a bandage (dressing), you should change it at least one time per day or as told by your health care provider. You should also change it if it becomes wet or dirty.  Keep the wound completely dry for the first 24 hours or as told by your health care provider. After that time, you may shower or bathe. However, make sure that the wound is not soaked in water until after the sutures or staples have been removed.  Clean the wound one time each day or as told by your health care provider:  Wash the wound with soap and water.  Rinse the wound with water to remove all soap.  Pat the wound dry with a clean towel. Do not rub the wound.  After cleaning the wound, apply a thin layer of antibiotic ointmentas told by your health care provider. This will help to prevent infection and keep  the dressing from sticking to the wound.  Have the sutures or staples removed as told by your health care provider. If skin adhesive strips were used:  Keep the wound clean and dry.  If you were given a bandage (dressing), you should change it at least one time per day or as told by your health care provider. You should also change it if it becomes dirty or wet.  Do not get the skin adhesive strips wet. You may shower or bathe, but be careful to keep the wound dry.  If the wound gets wet, pat it dry with a clean towel. Do not rub the wound.  Skin adhesive strips fall off on their own. You may trim the strips as the wound heals. Do not remove skin adhesive strips that are still stuck to the wound. They will fall off in time. If skin glue was used:  Try to keep the wound dry, but you may briefly wet it in the shower or bath. Do not soak the wound in water, such as by swimming.  After you have showered or bathed, gently pat the wound dry with a clean towel. Do not rub the wound.  Do not do any activities that will make you sweat heavily until the skin glue has fallen off on its own.  Do not apply liquid, cream, or ointment medicine to the wound while the skin glue is in place. Using those may loosen the film before the wound has healed.  If you were given a bandage (dressing), you should change it at least one time per day or as told by your health care provider. You should also change it if it becomes dirty or wet.  If a dressing is placed over the wound, be careful not to apply tape directly over the skin glue. Doing that may cause the glue to be pulled off before the wound has healed.  Do not pick at the glue. The skin glue usually remains in place for 5-10 days, then it falls off of the skin. General Instructions  Take over-the-counter and prescription medicines only as told by your health care provider.  If you were prescribed an antibiotic medicine or ointment, take or apply it as  told by your doctor. Do not stop using it even if your  condition improves.  To help prevent scarring, make sure to cover your wound with sunscreen whenever you are outside after stitches are removed, after adhesive strips are removed, or when glue remains in place and the wound is healed. Make sure to wear a sunscreen of at least 30 SPF.  Do not scratch or pick at the wound.  Keep all follow-up visits as told by your health care provider. This is important.  Check your wound every day for signs of infection. Watch for:  Redness, swelling, or pain.  Fluid, blood, or pus.  Raise (elevate) the injured area above the level of your heart while you are sitting or lying down, if possible. SEEK MEDICAL CARE IF:  You received a tetanus shot and you have swelling, severe pain, redness, or bleeding at the injection site.  You have a fever.  A wound that was closed breaks open.  You notice a bad smell coming from your wound or your dressing.  You notice something coming out of the wound, such as wood or glass.  Your pain is not controlled with medicine.  You have increased redness, swelling, or pain at the site of your wound.  You have fluid, blood, or pus coming from your wound.  You notice a change in the color of your skin near your wound.  You need to change the dressing frequently due to fluid, blood, or pus draining from the wound.  You develop a new rash.  You develop numbness around the wound. SEEK IMMEDIATE MEDICAL CARE IF:  You develop severe swelling around the wound.  Your pain suddenly increases and is severe.  You develop painful lumps near the wound or on skin that is anywhere on your body.  You have a red streak going away from your wound.  The wound is on your hand or foot and you cannot properly move a finger or toe.  The wound is on your hand or foot and you notice that your fingers or toes look pale or bluish.   This information is not intended to replace  advice given to you by your health care provider. Make sure you discuss any questions you have with your health care provider.   Document Released: 05/22/2005 Document Revised: 10/06/2014 Document Reviewed: 05/18/2014 Elsevier Interactive Patient Education 2016 [place], [place], or Adhesive Wound Closure Health care providers use stitches (sutures), staples, and certain glue (skin adhesives) to hold skin together while it heals (wound closure). You may need this treatment after you have surgery or if you cut your skin accidentally. These methods help your skin to heal more quickly and make it less likely that you will have a scar. A wound may take several months to heal completely. The type of wound you have determines when your wound gets closed. In most cases, the wound is closed as soon as possible (primary skin closure). Sometimes, closure is delayed so the wound can be cleaned and allowed to heal naturally. This reduces the chance of infection. Delayed closure may be needed if your wound:  Is caused by a bite.  Happened more than 6 hours ago.  Involves loss of skin or the tissues under the skin.  Has dirt or debris in it that cannot be removed.  Is infected. WHAT ARE THE DIFFERENT KINDS OF WOUND CLOSURES? There are many options for wound closure. The one that your health care provider uses depends on how deep and how large your wound is. Adhesive Glue To use this  type of glue to close a wound, your health care provider holds the edges of the wound together and paints the glue on the surface of your skin. You may need more than one layer of glue. Then the wound may be covered with a light bandage (dressing). This type of skin closure may be used for small wounds that are not deep (superficial). Using glue for wound closure is less painful than other methods. It does not require a medicine that numbs the area (local anesthetic). This method also leaves nothing to be removed.  Adhesive glue is often used for children and on facial wounds. Adhesive glue cannot be used for wounds that are deep, uneven, or bleeding. It is not used inside of a wound.  Adhesive Strips These strips are made of sticky (adhesive), porous paper. They are applied across your skin edges like a regular adhesive bandage. You leave them on until they fall off. Adhesive strips may be used to close very superficial wounds. They may also be used along with sutures to improve the closure of your skin edges.  Sutures Sutures are the oldest method of wound closure. Sutures can be made from natural substances, such as silk, or from synthetic materials, such as nylon and steel. They can be made from a material that your body can break down as your wound heals (absorbable), or they can be made from a material that needs to be removed from your skin (nonabsorbable). They come in many different strengths and sizes. Your health care provider attaches the sutures to a steel needle on one end. Sutures can be passed through your skin, or through the tissues beneath your skin. Then they are tied and cut. Your skin edges may be closed in one continuous stitch or in separate stitches. Sutures are strong and can be used for all kinds of wounds. Absorbable sutures may be used to close tissues under the skin. The disadvantage of sutures is that they may cause skin reactions that lead to infection. Nonabsorbable sutures need to be removed. Staples When surgical staples are used to close a wound, the edges of your skin on both sides of the wound are brought close together. A staple is placed across the wound, and an instrument secures the edges together. Staples are often used to close surgical cuts (incisions). Staples are faster to use than sutures, and they cause less skin reaction. Staples need to be removed using a tool that bends the staples away from your skin. HOW DO I CARE FOR MY WOUND CLOSURE?  Take medicines only as  directed by your health care provider.  If you were prescribed an antibiotic medicine for your wound, finish it all even if you start to feel better.  Use ointments or creams only as directed by your health care provider.  Wash your hands with soap and water before and after touching your wound.  Do not soak your wound in water. Do not take baths, swim, or use a hot tub until your health care provider approves.  Ask your health care provider when you can start showering. Cover your wound if directed by your health care provider.  Do not take out your own sutures or staples.  Do not pick at your wound. Picking can cause an infection.  Keep all follow-up visits as directed by your health care provider. This is important. HOW LONG WILL I HAVE MY WOUND CLOSURE?  Leave adhesive glue on your skin until the glue peels away.  Leave adhesive  strips on your skin until the strips fall off.  Absorbable sutures will dissolve within several days.  Nonabsorbable sutures and staples must be removed. The location of the wound will determine how long they stay in. This can range from several days to a couple of weeks. WHEN SHOULD I SEEK HELP FOR MY WOUND CLOSURE? Contact your health care provider if:  You have a fever.  You have chills.  You have drainage, redness, swelling, or pain at your wound.  There is a bad smell coming from your wound.  The skin edges of your wound start to separate after your sutures have been removed.  Your wound becomes thick, raised, and darker in color after your sutures come out (scarring).   This information is not intended to replace advice given to you by your health care provider. Make sure you discuss any questions you have with your health care provider.   Document Released: 02/14/2001 Document Revised: 06/12/2014 Document Reviewed: 10/29/2013 Elsevier Interactive Patient Education [employer].

## 2015-04-04 NOTE — ED Provider Notes (Signed)
[facility] Emergency Department Provider Note  ____________________________________________  Time seen: Approximately 8:28 PM  I have reviewed the triage vital signs and the nursing notes.   HISTORY  Chief Complaint Fall  History is somewhat limited by mild expressive aphasia which is chronic  HPI Dana Austin is a 81 y.o. female with a history of a stroke which resulted in mild expressive aphasia and several mechanical falls since the CVA who presents after a fall today.  She reports that she was bending down to pick up a pill off of the chair and lost her balance.  She has a significant skin tear to her right forearm and a large skin tear/laceration to her right lower leg where she hit a plastic bucket.  She did not hit her head, did not lose consciousness, and has no pain in her head or her neck.Bleeding is well controlled.  She also has some tenderness/pain to weightbearing in her right hip although she was ambulatory after the accident.  She denies chest pain, shortness of breath, nausea, vomiting, abdominal pain.  Pain in her skin tears is minimal.  Pain in her hip with weightbearing as mild.   Past Medical History  Diagnosis Date  . Stroke ([place])   . COPD (chronic obstructive pulmonary disease) ([place])   . Subdural hematoma (HCC)     There are no active problems to display for this patient.   History reviewed. No pertinent past surgical history.  Current Outpatient Rx  Name  Route  Sig  Dispense  Refill  . ALPRAZolam (XANAX) 0.25 MG tablet   Oral   Take 0.25 mg by mouth at bedtime as needed for anxiety.         Marland Kitchen atorvastatin (LIPITOR) 80 MG tablet   Oral   Take 80 mg by mouth daily.      0   . loratadine (CLARITIN) 10 MG tablet   Oral   Take 10 mg by mouth daily.         . metoprolol succinate (TOPROL-XL) 25 MG 24 hr tablet   Oral   Take 25 mg by mouth daily.         Marland Kitchen omeprazole (PRILOSEC) 20 MG capsule   Oral   Take 20 mg  by mouth daily.         Marland Kitchen docusate sodium (COLACE) 100 MG capsule      Take 1 tablet once or twice daily as needed for constipation while taking strong pain medicine   30 capsule   0   . traMADol (ULTRAM) 50 MG tablet      Take 1-2 tablets by mouth every 6 hours as needed for moderate to severe pain   30 tablet   0     Allergies Meloxicam  No family history on file.  Social History Social History  Substance Use Topics  . Smoking status: Never Smoker   . Smokeless tobacco: Never Used  . Alcohol Use: No     Comment: occasional    Review of Systems Constitutional: No fever/chills Eyes: No visual changes. ENT: No sore throat. Cardiovascular: Denies chest pain. Respiratory: Denies shortness of breath. Gastrointestinal: No abdominal pain.  No nausea, no vomiting.  No diarrhea.  No constipation. Genitourinary: Negative for dysuria. Musculoskeletal: Pain in left hip Skin: Since his skin tears to right forearm and right lower leg Neurological: Negative for headaches, focal weakness or numbness.  Chronic expressive aphasia  10-point ROS otherwise negative.  ____________________________________________   PHYSICAL  EXAM:  VITAL SIGNS: ED Triage Vitals  Enc Vitals Group     BP 04/04/15 1940 170/85 mmHg     Pulse Rate 04/04/15 1940 122     Resp 04/04/15 1940 16     Temp 04/04/15 1940 98 F (36.7 C)     Temp Source 04/04/15 1940 Oral     SpO2 04/04/15 1940 99 %     Weight 04/04/15 1940 93 lb (42.185 kg)     Height 04/04/15 1940 5\' 2"  (1.575 m)     Head Cir --      Peak Flow --      Pain Score 04/04/15 1941 7     Pain Loc --      Pain Edu? --      Excl. in [place]? --     Constitutional: Alert and oriented. Well appearing and in no acute distress. Eyes: Conjunctivae are normal. PERRL. EOMI. Head: Atraumatic. Nose: No congestion/rhinnorhea. Mouth/Throat: Mucous membranes are moist.  Oropharynx non-erythematous. Neck: No stridor.  No cervical spine tenderness to  palpation. Cardiovascular: Normal rate, regular rhythm. Grossly normal heart sounds.  Good peripheral circulation. Respiratory: Normal respiratory effort.  No retractions. Lungs CTAB. Gastrointestinal: Soft and nontender. No distention. No abdominal bruits. No CVA tenderness. Musculoskeletal: Mild tenderness to palpation of the left hip with pelvis is stable.  No pain with range of motion of joints.. Neurologic:  Normal speech and language. No gross focal neurologic deficits are appreciated other than mild expressive aphasia Skin:  Large superficial skin tear to the right forearm, a foot of which is visible in the media tab under chart review.  She also has a large greater than 12 cm long curved laceration/skin tear that goes through subcutaneous fat in her right lateral lower extremity.   ____________________________________________   LABS (all labs ordered are listed, but only abnormal results are displayed)  Labs Reviewed - No data to display ____________________________________________  EKG  Not indicated ____________________________________________  RADIOLOGY   Dg Hip Unilat With Pelvis 2-3 Views Left  04/04/2015  CLINICAL DATA:  Patient fell at home with left hip pain EXAM: DG HIP (WITH OR WITHOUT PELVIS) 2-3V LEFT COMPARISON:  None. FINDINGS: Diffuse osteopenia. Probable enchondroma or bone infarct greater trochanter on the right incidentally seen. Very subtle nondisplaced fractures. A mass on the left. Very subtle fracture of the left superior pubic ramus. No evidence of fracture or dislocation involving the left femur. IMPRESSION: Subtle fractures of the left pubic rami. Electronically Signed   By: Skipper Cliche M.D.   On: 04/04/2015 21:23    ____________________________________________   PROCEDURES  Procedure(s) performed: laceration repairs x 2, see procedure note(s).  LACERATION REPAIR Performed by: Hinda Kehr Authorized by: Hinda Kehr Consent: Verbal  consent obtained. Risks and benefits: risks, benefits and alternatives were discussed Consent given by: patient Patient identity confirmed: provided demographic data Prepped and Draped in normal sterile fashion Wound explored  Laceration Location: right forearm  Laceration Length: jagged skin tear, difficult to measure - approx 5 cm in width  No Foreign Bodies seen or palpated  Irrigation method: syringe Amount of cleaning: standard  Skin closure: steri-strips  Patient tolerance: Patient tolerated the procedure well with no immediate complications.   LACERATION REPAIR Performed by: Hinda Kehr Authorized by: Hinda Kehr Consent: Verbal consent obtained. Risks and benefits: risks, benefits and alternatives were discussed Consent given by: patient Patient identity confirmed: provided demographic data Prepped and Draped in normal sterile fashion Wound explored  Laceration Location: right lower  extremity (lateral)  Laceration Length: 12 cm  No Foreign Bodies seen or palpated  Irrigation method: syringe Amount of cleaning: copious  Technique: two layers of steri-strips, narrower strips to align skin flaps and wide strips to reinforce  Patient tolerance: Patient tolerated the procedure well with no immediate complications.   Critical Care performed: No ____________________________________________   INITIAL IMPRESSION / ASSESSMENT AND PLAN / ED COURSE  Pertinent labs & imaging results that were available during my care of the patient were reviewed by me and considered in my medical decision making (see chart for details).  I do not feel there is any indication for imaging of the right lower extremity in spite of the large laceration; there is no evidence of foreign body and there is no tenderness to palpation of the bone.  I will obtain images of the left hip as that is painful for her with weightbearing.  There is also no indication for imaging of the right upper  extremity.  Her family was uncertain of her last tetanus shot so I will give a Tdap today.    ----------------------------------------- 10:45 PM on 04/04/2015 -----------------------------------------  I was able to successfully repaired the skin tears including the deeper laceration in the right lower extremity that extended into her subcutaneous fat.  I considered reinforcing the Steri-Strips with sutures, but the closure was appropriate and secure and I decided to further secure the deeper areas of the middle of the wound with the wide Steri-Strips.  I tested the durability of the strips and it feels very secure.  I explained Steri-Strip wound care management to the patient and her family who understand and agree and know to keep the wound dry and to let the Steri-Strips fall off on their own.  They will follow-up with their regular doctor for wound care assessment.  There is no indication for antibiotics at this time.  The radiograph of her left hip revealed that she has 2 subtle pubic rami fractures.  I will have her she was ambulatory in spite of the discomfort.  I explained that these are typically managed nonoperatively and I verified this with Dr. Roland Rack by phone.  The patient and her family will give a trial of ambulation after some pain control.  If she is unable to do so that we will treat her pain and may need to admit her for pain control.  Otherwise she can follow-up with Dr. Roland Rack in one week.  They understand and agree with this plan. ____________________________________________  FINAL CLINICAL IMPRESSION(S) / ED DIAGNOSES  Final diagnoses:  Skin tear of right forearm without complication, initial encounter  Laceration of right lower leg, initial encounter  Pubic ramus fracture, left, closed, initial encounter ([place])      NEW MEDICATIONS STARTED DURING THIS VISIT:  New Prescriptions   DOCUSATE SODIUM (COLACE) 100 MG CAPSULE    Take 1 tablet once or twice daily as needed for  constipation while taking strong pain medicine   TRAMADOL (ULTRAM) 50 MG TABLET    Take 1-2 tablets by mouth every 6 hours as needed for moderate to severe pain     Hinda Kehr, MD 04/04/15 2247

## 2015-04-04 NOTE — ED Provider Notes (Signed)
-----------------------------------------   11:49 PM on 04/04/2015 -----------------------------------------  Assumed care of patient. She ambulated to the commode with heavy assistance; states it was very painful to ambulate. Currently rates pain 6/10. Review of vital signs reveal that patient has been persistently tachycardic since her arrival to the ED this evening; she is currently tachycardic at 127. Notes she recently had URI. Family thinks she may have UTI. Will obtain lab work and urinalysis. Given her pelvic fractures and persistent tachycardia, I am concerned for pelvic bleeding secondary to the fractures and will obtain CT imaging studies. Discussed with radiologist who recommends CT abdomen/pelvis to evaluate for pelvic bleeding.  ----------------------------------------- 2:28 AM on 04/05/2015 -----------------------------------------  HR 107. Awaiting CT scan results. Updated patient and family of laboratory results. Will initiate IV Rocephin for UTI.  Portable chest x-ray (viewed by me, interpreted per Dr. Dorann Lodge): Mild cardiomegaly and COPD. No superimposed acute pulmonary process.  Small nodular density projecting in periphery of RIGHT upper lobe, for which follow-up CT of the chest is recommended on a nonemergent Basis.  ----------------------------------------- 2:59 AM on 04/05/2015 -----------------------------------------  CT abdomen and pelvis with contrast interpreted per Dr. Alroy Dust: No evidence of a significant acute traumatic injury in the abdomen or pelvis. No acute findings are evident. Moderate diverticulosis.  Updated patient of CT results. Heart rate currently 103. Discussed case with hospitalist to evaluate in the ED for admission.  Paulette Blanch, MD 04/05/15 [phone]

## 2015-04-04 NOTE — ED Notes (Signed)
Per pt she went outside to get a pillow off of the chair and lost her balance. Pt presents with right arm skin tear and right leg from under knee to ankle abrasion. Pt denies LOC or hitting head. Pt is alert at this time.

## 2015-04-04 NOTE — ED Notes (Signed)
Assisted Dr. Karma Greaser with wound care.  RUE laceration irrigated with NS and MD placed steri strips.  Covered with 4x4 and wrapped with kling.  RLE irrigated with NS and MD placed multiple steri strips. Covered with 4x4 and wrapped with Kerlex.  Pt c/o minimal pain. Assisted patient to bathroom. C/o pain 6/10.

## 2015-04-05 ENCOUNTER — Emergency Department: Payer: PPO

## 2015-04-05 DIAGNOSIS — A419 Sepsis, unspecified organism: Secondary | ICD-10-CM | POA: Diagnosis present

## 2015-04-05 LAB — CBC WITH DIFFERENTIAL/PLATELET
BASOS ABS: 0.3 10*3/uL — AB (ref 0–0.1)
Basophils Relative: 2 %
EOS PCT: 1 %
Eosinophils Absolute: 0.2 10*3/uL (ref 0–0.7)
HCT: 41.3 % (ref 35.0–47.0)
HEMOGLOBIN: 13.4 g/dL (ref 12.0–16.0)
LYMPHS ABS: 1.8 10*3/uL (ref 1.0–3.6)
Lymphocytes Relative: 12 %
MCH: 29.4 pg (ref 26.0–34.0)
MCHC: 32.5 g/dL (ref 32.0–36.0)
MCV: 90.6 fL (ref 80.0–100.0)
Monocytes Absolute: 0.9 10*3/uL (ref 0.2–0.9)
Monocytes Relative: 6 %
NEUTROS PCT: 79 %
Neutro Abs: 11.9 10*3/uL — ABNORMAL HIGH (ref 1.4–6.5)
PLATELETS: 262 10*3/uL (ref 150–440)
RBC: 4.56 MIL/uL (ref 3.80–5.20)
RDW: 14.5 % (ref 11.5–14.5)
WBC: 14.9 10*3/uL — AB (ref 3.6–11.0)

## 2015-04-05 LAB — URINALYSIS COMPLETE WITH MICROSCOPIC (ARMC ONLY)
Bacteria, UA: NONE SEEN
Bilirubin Urine: NEGATIVE
Glucose, UA: NEGATIVE mg/dL
HGB URINE DIPSTICK: NEGATIVE
Ketones, ur: NEGATIVE mg/dL
Nitrite: NEGATIVE
PH: 5 (ref 5.0–8.0)
Protein, ur: 100 mg/dL — AB
Specific Gravity, Urine: 1.017 (ref 1.005–1.030)

## 2015-04-05 LAB — COMPREHENSIVE METABOLIC PANEL
ALBUMIN: 4 g/dL (ref 3.5–5.0)
ALK PHOS: 106 U/L (ref 38–126)
ALT: 37 U/L (ref 14–54)
AST: 31 U/L (ref 15–41)
Anion gap: 9 (ref 5–15)
BUN: 16 mg/dL (ref 6–20)
CALCIUM: 9.3 mg/dL (ref 8.9–10.3)
CHLORIDE: 108 mmol/L (ref 101–111)
CO2: 24 mmol/L (ref 22–32)
CREATININE: 0.71 mg/dL (ref 0.44–1.00)
GFR calc Af Amer: >60 mL/min (ref >60)
GFR calc non Af Amer: >60 mL/min (ref >60)
GLUCOSE: 137 mg/dL — AB (ref 65–99)
Potassium: 3.6 mmol/L (ref 3.5–5.1)
SODIUM: 141 mmol/L (ref 135–145)
Total Bilirubin: 0.8 mg/dL (ref 0.3–1.2)
Total Protein: 6.6 g/dL (ref 6.5–8.1)

## 2015-04-05 LAB — HEMOGLOBIN A1C: Hgb A1c MFr Bld: 6.6 % — ABNORMAL HIGH (ref 4.0–6.0)

## 2015-04-05 LAB — TSH: TSH: 3.143 u[IU]/mL (ref 0.350–4.500)

## 2015-04-05 LAB — TROPONIN I: Troponin I: 0.03 ng/mL (ref <0.031)

## 2015-04-05 MED ORDER — IOHEXOL 300 MG/ML  SOLN
75.0000 mL | Freq: Once | INTRAMUSCULAR | Status: AC | PRN
Start: 2015-04-05 — End: 2015-04-05
  Administered 2015-04-05: 75 mL via INTRAVENOUS

## 2015-04-05 MED ORDER — MORPHINE SULFATE (PF) 2 MG/ML IV SOLN: 1.0000 mg | INTRAVENOUS | Status: DC | PRN

## 2015-04-05 MED ORDER — ATORVASTATIN CALCIUM 20 MG PO TABS
80.0000 mg | ORAL_TABLET | Freq: Every day | ORAL | Status: DC
  Administered 2015-04-05: 20:00:00 40 mg via ORAL
  Filled 2015-04-05: qty 4

## 2015-04-05 MED ORDER — DEXTROSE 5 % IV SOLN
1.0000 g | INTRAVENOUS | Status: DC
  Administered 2015-04-05 – 2015-04-06 (×2): 1 g via INTRAVENOUS
  Filled 2015-04-05 (×3): qty 10

## 2015-04-05 MED ORDER — METOPROLOL SUCCINATE ER 25 MG PO TB24
25.0000 mg | ORAL_TABLET | Freq: Every day | ORAL | Status: DC
  Administered 2015-04-05 – 2015-04-06 (×2): 25 mg via ORAL
  Filled 2015-04-05 (×2): qty 1

## 2015-04-05 MED ORDER — DOCUSATE SODIUM 100 MG PO CAPS
100.0000 mg | ORAL_CAPSULE | Freq: Two times a day (BID) | ORAL | Status: DC
  Administered 2015-04-05 – 2015-04-06 (×3): 100 mg via ORAL
  Filled 2015-04-05 (×3): qty 1

## 2015-04-05 MED ORDER — ONDANSETRON HCL 4 MG/2ML IJ SOLN: 4.0000 mg | Freq: Four times a day (QID) | INTRAMUSCULAR | Status: DC | PRN

## 2015-04-05 MED ORDER — SODIUM CHLORIDE 0.9 % IJ SOLN
3.0000 mL | Freq: Two times a day (BID) | INTRAMUSCULAR | Status: DC
  Administered 2015-04-05 – 2015-04-06 (×3): 3 mL via INTRAVENOUS

## 2015-04-05 MED ORDER — ACETAMINOPHEN 650 MG RE SUPP: 650.0000 mg | Freq: Four times a day (QID) | RECTAL | Status: DC | PRN

## 2015-04-05 MED ORDER — VANCOMYCIN HCL 500 MG IV SOLR
500.0000 mg | INTRAVENOUS | Status: DC
  Administered 2015-04-05: 06:00:00 500 mg via INTRAVENOUS
  Filled 2015-04-05: qty 500

## 2015-04-05 MED ORDER — SODIUM CHLORIDE 0.9 % IV SOLN
INTRAVENOUS | Status: DC
  Administered 2015-04-05: 06:00:00 via INTRAVENOUS

## 2015-04-05 MED ORDER — ACETAMINOPHEN 325 MG PO TABS: 650.0000 mg | ORAL_TABLET | Freq: Four times a day (QID) | ORAL | Status: DC | PRN

## 2015-04-05 MED ORDER — ALPRAZOLAM 0.25 MG PO TABS: 0.2500 mg | ORAL_TABLET | Freq: Every evening | ORAL | Status: DC | PRN

## 2015-04-05 MED ORDER — HEPARIN SODIUM (PORCINE) 5000 UNIT/ML IJ SOLN
5000.0000 [IU] | Freq: Three times a day (TID) | INTRAMUSCULAR | Status: DC
  Administered 2015-04-05 – 2015-04-06 (×4): 5000 [IU] via SUBCUTANEOUS
  Filled 2015-04-05 (×4): qty 1

## 2015-04-05 MED ORDER — LORATADINE 10 MG PO TABS
10.0000 mg | ORAL_TABLET | Freq: Every day | ORAL | Status: DC
  Administered 2015-04-05 – 2015-04-06 (×2): 10 mg via ORAL
  Filled 2015-04-05 (×2): qty 1

## 2015-04-05 MED ORDER — PANTOPRAZOLE SODIUM 40 MG PO TBEC
40.0000 mg | DELAYED_RELEASE_TABLET | Freq: Every day | ORAL | Status: DC
  Administered 2015-04-05 – 2015-04-06 (×2): 40 mg via ORAL
  Filled 2015-04-05 (×2): qty 1

## 2015-04-05 MED ORDER — ENSURE ENLIVE PO LIQD
237.0000 mL | ORAL | Status: DC
  Administered 2015-04-06: 237 mL via ORAL

## 2015-04-05 MED ORDER — ONDANSETRON HCL 4 MG PO TABS
4.0000 mg | ORAL_TABLET | Freq: Four times a day (QID) | ORAL | Status: DC | PRN
Start: 2015-04-05 — End: 2015-04-06

## 2015-04-05 NOTE — Care Management (Signed)
Admitted to [facility] with the diagnosis of sepsis. Lives with daughter Vear Clock [phone]) since March. Lived alone until March. Last seen Dr. Ginette Pitman sometime this month, next appointment is in November. No home Health. No skilled facility. No home oxygen. Uses a cane/rolling walker to aid in ambulation. [facility] last March and again prior to this admission. Good appetite. No Life Alert.  Takes care of all basic activities of daily living herself. Daughter helps with instrumental activities of daily living. Daughter will transport. Physical therapy evaluation pending. Shelbie Ammons RN MSN  [facility] Care Management [phone]

## 2015-04-05 NOTE — Progress Notes (Signed)
ANTIBIOTIC CONSULT NOTE - INITIAL  Pharmacy Consult for vancomycin Indication: rule out sepsis  Allergies  Allergen Reactions  . Meloxicam Shortness Of Breath    Pt is not aware she is allergic to this medication. She "becomes short of breath all the time"     Patient Measurements: Height: 5\' 2"  (157.5 cm) Weight: 93 lb (42.185 kg) IBW/kg (Calculated) : 50.1 Adjusted Body Weight: 42.2 kg  Vital Signs: Temp: 98.8 F (37.1 C) (10/31 0421) Temp Source: Oral (10/31 0421) BP: 140/75 mmHg (10/31 0421) Pulse Rate: 109 (10/31 0421) Intake/Output from previous day:   Intake/Output from this shift:    Labs:  Recent Labs  04/05/15 0023  WBC 14.9*  HGB 13.4  PLT 262  CREATININE 0.71   Estimated Creatinine Clearance: 32.4 mL/min (by C-G formula based on Cr of 0.71). No results for input(s): VANCOTROUGH, VANCOPEAK, VANCORANDOM, GENTTROUGH, GENTPEAK, GENTRANDOM, TOBRATROUGH, TOBRAPEAK, TOBRARND, AMIKACINPEAK, AMIKACINTROU, AMIKACIN in the last 72 hours.   Microbiology: No results found for this or any previous visit (from the past 720 hour(s)).  Medical History: Past Medical History  Diagnosis Date  . Stroke ([place])   . COPD (chronic obstructive pulmonary disease) ([place])   . Subdural hematoma (HCC)     Medications:  Infusions:  . sodium chloride     Assessment: 89 yof cc hip pain with UTI and meets sepsis criteria, starting Rocephin and vancomycin.   Vd 29.5 L, Ke 0.031 hr-1, T1/2 22.2 hr  Goal of Therapy:  Vancomycin trough level 15-20 mcg/ml  Plan:  Expected duration 7 days with resolution of temperature and/or normalization of WBC. Vancomycin 500 mg IV Q24H, predicted trough 15 mcg/mL. Will continue to follow and adjust dose as needed to maintain trough 15 to 20 mcg/mL.  Laural Benes, Pharm.D. Clinical Pharmacist 04/05/2015,4:55 AM

## 2015-04-05 NOTE — H&P (Signed)
Dana Austin is an 81 y.o. female.   Chief Complaint: Hip pain HPI: The patient presents emergency department via EMS from her nursing home after suffering a fall. Patient states that she bent over to pick up a pill when she became dizzy and fell backwards. She denies loss of consciousness. She had hip pain upon arrival. X-ray of the hip showed some concern for small fractures however CT of the pelvis later showed no broken bones. Laboratory evaluation revealed urinary tract infection. Upon ambulation the patient required significant assistance.  Weakness in addition to meeting septic criteria prompted the emergency department staff to call for admission.  Past Medical History  Diagnosis Date  . Stroke ([place])   . COPD (chronic obstructive pulmonary disease) ([place])   . Subdural hematoma [facility])     Past Surgical History  Procedure Laterality Date  . None      Family History  Problem Relation Age of Onset  . CAD Brother   . Diabetes Mellitus II Mother    Social History:  reports that she has never smoked. She has never used smokeless tobacco. She reports that she does not drink alcohol or use illicit drugs.  Allergies:  Allergies  Allergen Reactions  . Meloxicam Shortness Of Breath    Pt is not aware she is allergic to this medication. She "becomes short of breath all the time"     Prior to Admission medications   Medication Sig Start Date End Date Taking? Authorizing Provider  ALPRAZolam Duanne Moron) 0.25 MG tablet Take 0.25 mg by mouth at bedtime as needed for anxiety.   Yes Historical Provider, MD  atorvastatin (LIPITOR) 80 MG tablet Take 80 mg by mouth daily. 03/01/15  Yes Historical Provider, MD  loratadine (CLARITIN) 10 MG tablet Take 10 mg by mouth daily.   Yes Historical Provider, MD  metoprolol succinate (TOPROL-XL) 25 MG 24 hr tablet Take 25 mg by mouth daily.   Yes Historical Provider, MD  omeprazole (PRILOSEC) 20 MG capsule Take 20 mg by mouth daily.   Yes Historical  Provider, MD  docusate sodium (COLACE) 100 MG capsule Take 1 tablet once or twice daily as needed for constipation while taking strong pain medicine 04/04/15   Hinda Kehr, MD  traMADol Veatrice Bourbon) 50 MG tablet Take 1-2 tablets by mouth every 6 hours as needed for moderate to severe pain 04/04/15   Hinda Kehr, MD     Results for orders placed or performed during the hospital encounter of 04/04/15 (from the past 48 hour(s))  CBC with Differential     Status: Abnormal   Collection Time: 04/05/15 12:23 AM  Result Value Ref Range   WBC 14.9 (H) 3.6 - 11.0 K/uL   RBC 4.56 3.80 - 5.20 MIL/uL   Hemoglobin 13.4 12.0 - 16.0 g/dL   HCT 41.3 35.0 - 47.0 %   MCV 90.6 80.0 - 100.0 fL   MCH 29.4 26.0 - 34.0 pg   MCHC 32.5 32.0 - 36.0 g/dL   RDW 14.5 11.5 - 14.5 %   Platelets 262 150 - 440 K/uL   Neutrophils Relative % 79 %   Neutro Abs 11.9 (H) 1.4 - 6.5 K/uL   Lymphocytes Relative 12 %   Lymphs Abs 1.8 1.0 - 3.6 K/uL   Monocytes Relative 6 %   Monocytes Absolute 0.9 0.2 - 0.9 K/uL   Eosinophils Relative 1 %   Eosinophils Absolute 0.2 0 - 0.7 K/uL   Basophils Relative 2 %   Basophils Absolute 0.3 (  H) 0 - 0.1 K/uL  Comprehensive metabolic panel     Status: Abnormal   Collection Time: 04/05/15 12:23 AM  Result Value Ref Range   Sodium 141 135 - 145 mmol/L   Potassium 3.6 3.5 - 5.1 mmol/L   Chloride 108 101 - 111 mmol/L   CO2 24 22 - 32 mmol/L   Glucose, Bld 137 (H) 65 - 99 mg/dL   BUN 16 6 - 20 mg/dL   Creatinine, Ser 0.71 0.44 - 1.00 mg/dL   Calcium 9.3 8.9 - 10.3 mg/dL   Total Protein 6.6 6.5 - 8.1 g/dL   Albumin 4.0 3.5 - 5.0 g/dL   AST 31 15 - 41 U/L   ALT 37 14 - 54 U/L   Alkaline Phosphatase 106 38 - 126 U/L   Total Bilirubin 0.8 0.3 - 1.2 mg/dL   GFR calc non Af Amer >60 >60 mL/min   GFR calc Af Amer >60 >60 mL/min    Comment: (NOTE) The eGFR has been calculated using the CKD EPI equation. This calculation has not been validated in all clinical situations. eGFR's  persistently <60 mL/min signify possible Chronic Kidney Disease.    Anion gap 9 5 - 15  Troponin I     Status: None   Collection Time: 04/05/15 12:23 AM  Result Value Ref Range   Troponin I 0.03 <0.031 ng/mL    Comment:        NO INDICATION OF MYOCARDIAL INJURY.   TSH     Status: None   Collection Time: 04/05/15 12:23 AM  Result Value Ref Range   TSH 3.143 0.350 - 4.500 uIU/mL  Urinalysis complete, with microscopic ([facility] only)     Status: Abnormal   Collection Time: 04/05/15  1:51 AM  Result Value Ref Range   Color, Urine YELLOW (A) YELLOW   APPearance CLEAR (A) CLEAR   Glucose, UA NEGATIVE NEGATIVE mg/dL   Bilirubin Urine NEGATIVE NEGATIVE   Ketones, ur NEGATIVE NEGATIVE mg/dL   Specific Gravity, Urine 1.017 1.005 - 1.030   Hgb urine dipstick NEGATIVE NEGATIVE   pH 5.0 5.0 - 8.0   Protein, ur 100 (A) NEGATIVE mg/dL   Nitrite NEGATIVE NEGATIVE   Leukocytes, UA 1+ (A) NEGATIVE   RBC / HPF 0-5 0 - 5 RBC/hpf   WBC, UA 6-30 0 - 5 WBC/hpf   Bacteria, UA NONE SEEN NONE SEEN   Squamous Epithelial / LPF 0-5 (A) NONE SEEN   Mucous PRESENT    Hyaline Casts, UA PRESENT    Ct Abdomen Pelvis W Contrast  04/05/2015  CLINICAL DATA:  [place] yesterday after losing her balance. EXAM: CT ABDOMEN AND PELVIS WITH CONTRAST TECHNIQUE: Multidetector CT imaging of the abdomen and pelvis was performed using the standard protocol following bolus administration of intravenous contrast. CONTRAST:  37m OMNIPAQUE IOHEXOL 300 MG/ML  SOLN COMPARISON:  None. FINDINGS: No acute fracture or other significant musculoskeletal lesion is evident. Moderately severe degenerative disc and facet changes are present in the lumbar spine. There are multiple hepatic cysts, unchanged by report since 09/19/2006. There are otherwise unremarkable appearances of the liver, spleen, pancreas, adrenals and kidneys. There is no peritoneal blood or free air. Bowel is remarkable for moderate uncomplicated colonic diverticulosis. The  abdominal aorta is normal in caliber and intact, with extensive atherosclerotic calcification. Uterus and adnexal regions are unremarkable. Benign appearing atelectasis in the medial aspect of the left lower lobe, unchanged by report since 09/19/2006. IMPRESSION: No evidence of a significant acute traumatic injury  in the abdomen or pelvis. No acute findings are evident. Moderate diverticulosis. Electronically Signed   By: Andreas Newport M.D.   On: 04/05/2015 02:38   Dg Chest Port 1 View  04/05/2015  CLINICAL DATA:  Tachycardia.  History of COPD. EXAM: PORTABLE CHEST 1 VIEW COMPARISON:  Esophagram Oct 07, 2014 FINDINGS: Cardiac silhouette is mildly enlarged. Mildly calcified aortic knob. Increased lung volumes, compatible with history of COPD. No pleural effusion or focal consolidation. Small nodular density projecting in the periphery of the RIGHT upper lobe. No pneumothorax. Patient is osteopenic. Soft tissue planes are normal. IMPRESSION: Mild cardiomegaly and COPD. No superimposed acute pulmonary process. Small nodular density projecting in periphery of RIGHT upper lobe, for which follow-up CT of the chest is recommended on a nonemergent basis. Electronically Signed   By: Elon Alas M.D.   On: 04/05/2015 01:56   Dg Hip Unilat With Pelvis 2-3 Views Left  04/04/2015  CLINICAL DATA:  Patient fell at home with left hip pain EXAM: DG HIP (WITH OR WITHOUT PELVIS) 2-3V LEFT COMPARISON:  None. FINDINGS: Diffuse osteopenia. Probable enchondroma or bone infarct greater trochanter on the right incidentally seen. Very subtle nondisplaced fractures. A mass on the left. Very subtle fracture of the left superior pubic ramus. No evidence of fracture or dislocation involving the left femur. IMPRESSION: Subtle fractures of the left pubic rami. Electronically Signed   By: Skipper Cliche M.D.   On: 04/04/2015 21:23    Review of Systems  Constitutional: Negative for fever and chills.  HENT: Negative for sore  throat and tinnitus.   Eyes: Negative for blurred vision and redness.  Respiratory: Negative for cough and shortness of breath.   Cardiovascular: Negative for chest pain, palpitations, orthopnea and PND.  Gastrointestinal: Negative for nausea, vomiting, abdominal pain and diarrhea.  Genitourinary: Negative for dysuria, urgency and frequency.  Musculoskeletal: Positive for falls. Negative for myalgias and joint pain.  Skin: Negative for rash.       No lesions  Neurological: Negative for speech change, focal weakness and weakness.  Endo/Heme/Allergies: Does not bruise/bleed easily.       No temperature intolerance  Psychiatric/Behavioral: Negative for depression and suicidal ideas.    Blood pressure 123/59, pulse 108, temperature 98 F (36.7 C), temperature source Oral, resp. rate 21, height '5\' 2"'  (1.575 m), weight 42.185 kg (93 lb), SpO2 91 %. Physical Exam  Vitals reviewed. Constitutional: She is oriented to person, place, and time. She appears well-developed and well-nourished. No distress.  HENT:  Head: Normocephalic and atraumatic.  Mouth/Throat: Oropharynx is clear and moist.  Eyes: Conjunctivae and EOM are normal. Pupils are equal, round, and reactive to light. No scleral icterus.  Neck: Normal range of motion. Neck supple. No JVD present. No tracheal deviation present. No thyromegaly present.  Cardiovascular: Normal rate, regular rhythm and normal heart sounds.  Exam reveals no gallop and no friction rub.   No murmur heard. Respiratory: Effort normal and breath sounds normal.  GI: Soft. Bowel sounds are normal. She exhibits no distension. There is no tenderness.  Genitourinary:  Deferred  Musculoskeletal: Normal range of motion. She exhibits tenderness. She exhibits no edema.  Lymphadenopathy:    She has no cervical adenopathy.  Neurological: She is alert and oriented to person, place, and time. No cranial nerve deficit. She exhibits normal muscle tone.  Skin: Skin is warm  and dry. No rash noted. No erythema.  Abrasions to right arm and right lower leg  Psychiatric: She has a normal  mood and affect. Her behavior is normal. Judgment and thought content normal.     Assessment/Plan This is an 81 year old Caucasian female admitted for sepsis and urinary tract infection. 1. Urinary tract infection: The patient has received ceftriaxone in the emergency department. Blood cultures and urine cultures have been obtained. I have added vancomycin for broad-spectrum coverage until we rule out bacteremia.  2. Sepsis: The patient meets criteria via heart rate, tachypnea and leukocytosis. She is hemodynamically stable. We'll continue to fluid resuscitate. 3. Weakness: Multifactorial; likely exacerbated by urinary tract infection. I have ordered physical therapy and occupational therapy evaluation. 4. DVT prophylaxis: Heparin 5. GI prophylaxis: Pantoprazole The patient is a full code. Time spent on admission orders and patient care approximately 35 minutes  Harrie Foreman 04/05/2015, 3:41 AM

## 2015-04-05 NOTE — Progress Notes (Signed)
   04/05/15 0940  Clinical Encounter Type  Visited With Patient  Visit Type Initial  Provided pastoral presence and support to patient on unit.  [place] [phone]

## 2015-04-05 NOTE — Progress Notes (Addendum)
[facility] [place] NAME: Dana Austin    MR#:  [id number]  DATE OF BIRTH:  04-20-1925  SUBJECTIVE: 81 year old female admitted because of fall, difficulty ambulation, sepsis due to UTI. Patient seen today, denies any abdominal pain, dysuria. Complains of  right leg pain. Eager to work with physical therapy and wants to go home. Denies any back pain, numbness in the legs.   CHIEF COMPLAINT:   Chief Complaint  Patient presents with  . Fall    REVIEW OF SYSTEMS:   ROS CONSTITUTIONAL: No fever, fatigue or weakness.  EYES: No blurred or double vision.  EARS, NOSE, AND THROAT: No tinnitus or ear pain.  RESPIRATORY: No cough, shortness of breath, wheezing or hemoptysis.  CARDIOVASCULAR: No chest pain, orthopnea, edema.  GASTROINTESTINAL: No nausea, vomiting, diarrhea or abdominal pain.  GENITOURINARY: No dysuria, hematuria.  ENDOCRINE: No polyuria, nocturia,  HEMATOLOGY: No anemia, easy bruising or bleeding SKIN: No rash or lesion. MUSCULOSKELETAL: No joint pain or arthritis.   NEUROLOGIC: No tingling, numbness, weakness.  PSYCHIATRY: No anxiety or depression.   DRUG ALLERGIES:   Allergies  Allergen Reactions  . Meloxicam Shortness Of Breath    Pt is not aware she is allergic to this medication. She "becomes short of breath all the time"     VITALS:  Blood pressure 140/75, pulse 109, temperature 98.8 F (37.1 C), temperature source Oral, resp. rate 18, height 5\' 2"  (1.575 m), weight 42.865 kg (94 lb 8 oz), SpO2 95 %.  PHYSICAL EXAMINATION:  GENERAL:  81 y.o.-year-old patient lying in the bed with no acute distress.  EYES: Pupils equal, round, reactive to light and accommodation. No scleral icterus. Extraocular muscles intact.  HEENT: Head atraumatic, normocephalic. Oropharynx and nasopharynx clear.  NECK:  Supple, no jugular venous distention. No thyroid enlargement, no tenderness.  LUNGS: Normal breath sounds bilaterally, no  wheezing, rales,rhonchi or crepitation. No use of accessory muscles of respiration.  CARDIOVASCULAR: S1, S2 normal. No murmurs, rubs, or gallops.  ABDOMEN: Soft, nontender, nondistended. Bowel sounds present. No organomegaly or mass.  EXTREMITIES: No pedal edema, cyanosis, or clubbing.  NEUROLOGIC: Cranial nerves II through XII are intact. Muscle strength 5/5 in all extremities. Sensation intact. Gait not checked.  PSYCHIATRIC: The patient is alert and oriented x 3.  SKIN: No obvious rash, lesion, or ulcer.    LABORATORY PANEL:   CBC  Recent Labs Lab 04/05/15 0023  WBC 14.9*  HGB 13.4  HCT 41.3  PLT 262   ------------------------------------------------------------------------------------------------------------------  Chemistries   Recent Labs Lab 04/05/15 0023  NA 141  K 3.6  CL 108  CO2 24  GLUCOSE 137*  BUN 16  CREATININE 0.71  CALCIUM 9.3  AST 31  ALT 37  ALKPHOS 106  BILITOT 0.8   ------------------------------------------------------------------------------------------------------------------  Cardiac Enzymes  Recent Labs Lab 04/05/15 0023  TROPONINI 0.03   ------------------------------------------------------------------------------------------------------------------  RADIOLOGY:  Ct Abdomen Pelvis W Contrast  04/05/2015  CLINICAL DATA:  [place] yesterday after losing her balance. EXAM: CT ABDOMEN AND PELVIS WITH CONTRAST TECHNIQUE: Multidetector CT imaging of the abdomen and pelvis was performed using the standard protocol following bolus administration of intravenous contrast. CONTRAST:  55mL OMNIPAQUE IOHEXOL 300 MG/ML  SOLN COMPARISON:  None. FINDINGS: No acute fracture or other significant musculoskeletal lesion is evident. Moderately severe degenerative disc and facet changes are present in the lumbar spine. There are multiple hepatic cysts, unchanged by report since 09/19/2006. There are otherwise unremarkable appearances of the liver, spleen,  pancreas, adrenals and kidneys. There is no peritoneal blood or free air. Bowel is remarkable for moderate uncomplicated colonic diverticulosis. The abdominal aorta is normal in caliber and intact, with extensive atherosclerotic calcification. Uterus and adnexal regions are unremarkable. Benign appearing atelectasis in the medial aspect of the left lower lobe, unchanged by report since 09/19/2006. IMPRESSION: No evidence of a significant acute traumatic injury in the abdomen or pelvis. No acute findings are evident. Moderate diverticulosis. Electronically Signed   By: Andreas Newport M.D.   On: 04/05/2015 02:38   Dg Chest Port 1 View  04/05/2015  CLINICAL DATA:  Tachycardia.  History of COPD. EXAM: PORTABLE CHEST 1 VIEW COMPARISON:  Esophagram Oct 07, 2014 FINDINGS: Cardiac silhouette is mildly enlarged. Mildly calcified aortic knob. Increased lung volumes, compatible with history of COPD. No pleural effusion or focal consolidation. Small nodular density projecting in the periphery of the RIGHT upper lobe. No pneumothorax. Patient is osteopenic. Soft tissue planes are normal. IMPRESSION: Mild cardiomegaly and COPD. No superimposed acute pulmonary process. Small nodular density projecting in periphery of RIGHT upper lobe, for which follow-up CT of the chest is recommended on a nonemergent basis. Electronically Signed   By: Elon Alas M.D.   On: 04/05/2015 01:56   Dg Hip Unilat With Pelvis 2-3 Views Left  04/04/2015  CLINICAL DATA:  Patient fell at home with left hip pain EXAM: DG HIP (WITH OR WITHOUT PELVIS) 2-3V LEFT COMPARISON:  None. FINDINGS: Diffuse osteopenia. Probable enchondroma or bone infarct greater trochanter on the right incidentally seen. Very subtle nondisplaced fractures. A mass on the left. Very subtle fracture of the left superior pubic ramus. No evidence of fracture or dislocation involving the left femur. IMPRESSION: Subtle fractures of the left pubic rami. Electronically Signed    By: Skipper Cliche M.D.   On: 04/04/2015 21:23    EKG:   Orders placed or performed during the hospital encounter of 04/04/15  . ED EKG  . ED EKG  . EKG 12-Lead  . EKG 12-Lead    ASSESSMENT AND PLAN:   #1 UTI with sepsis: Present on admission. Patient had tach tachypnea leukocytosis and tachycardia. Right now improved with IV hydration, continue IV Rocephin, follow urine cultures. #2 generalized weakness likely due to UTI: Patient the has ambulatory  Difficulties, so physical therapy is consulted before discharging the patient.  #3 history of COPD stable no wheezing. #4 hypertension controlled  X-ray of the left hip showed subtle  fracture of left pubic ramus: Patient's family is concerned;  Will request orthopedic consult. Snf recommended by physical therapy. Continue pain medications. All the records are reviewed and case discussed with Care Management/Social Workerr. Management plans discussed with the patient, family and they are in agreement.  CODE STATUS: Full  TOTAL TIME TAKING CARE OF THIS PATIENT: 73minutes.   POSSIBLE D/C IN 1-2 DAYS, DEPENDING ON CLINICAL CONDITION.   Epifanio Lesches M.D on 04/05/2015 at 9:07 AM  Between 7am to 6pm - Pager - [phone]  After 6pm go to [email or web address] - password [email or web address] [facility]  Office  [phone]  CC: Primary care physician; Tracie Harrier, MD   Note: This dictation was prepared with Dragon dictation along with smaller phrase technology. Any transcriptional errors that result from this process are unintentional.

## 2015-04-05 NOTE — Evaluation (Signed)
Physical Therapy Evaluation Patient Details Name: Dana Austin MRN: [id number] DOB: 02-22-1925 Today's Date: 04/05/2015   History of Present Illness  Pt was admitted to the hospital s/p fall while bending over. When at the hospital she was found to have UTI. Pt also suffered lacerations to R U/LE   Clinical Impression  Pt presents with hx of stroke, COPD, subdural hematoma, and UTI. Examination reveals that pt performs all mobility at min assist. Her ambulation quality is greatly reduced from baseline, requiring assist for management of RW and cues for bigger steps. She has generalized weakness and states feeling weaker with her ambulation than prior to admission. Family member not available to provide full hx of PLOF (cognitive impairments made pt poor historian). Secondary to pt weakness and gait deficits, she will continue to benefit from skilled PT in order to eventually return home safely. She is very pleasant and willing to participate in therapy tasks.     Follow Up Recommendations SNF    Equipment Recommendations   (TBD)    Recommendations for Other Services       Precautions / Restrictions Precautions Precautions: Fall Restrictions Weight Bearing Restrictions: No      Mobility  Bed Mobility Overal bed mobility: Modified Independent;Needs Assistance Bed Mobility: Supine to Sit;Sit to Supine     Supine to sit: Min assist Sit to supine: Min assist   General bed mobility comments: Pt requires minor assist for trunk. Good management of trunk and LEs  Transfers Overall transfer level: Needs assistance Equipment used: Rolling walker (2 wheeled) Transfers: Sit to/from Stand Sit to Stand: Min assist         General transfer comment: Needs minor assist for getting into standing. She also needs cues on appropriate use of hands with RW. Pt relatively stable once standing. No LOB or unsteadiness   Ambulation/Gait Ambulation/Gait assistance: Min assist Ambulation  Distance (Feet): 20 Feet Assistive device: Rolling walker (2 wheeled) Gait Pattern/deviations: Step-to pattern;Decreased step length - right;Decreased step length - left;Decreased stride length;Shuffle Gait velocity: greatly decreased Gait velocity interpretation: <1.8 ft/sec, indicative of risk for recurrent falls General Gait Details: Pt with greatly reduced step lenghth and shuffle pattern. She can take slighly larger steps when cued to do so but her gait speed remains very slow. She states fatigue after 15 ft of ambulation   Stairs            Wheelchair Mobility    Modified Rankin (Stroke Patients Only)       Balance Overall balance assessment: History of Falls                                           Pertinent Vitals/Pain Pain Assessment: No/denies pain    Home Living Family/patient expects to be discharged to:: Private residence Living Arrangements: Children (Lives with daughter) Available Help at Discharge: Available PRN/intermittently Type of Home:  (Obtain hx from daughter)           Additional Comments: Pt cognition makes hx potentially unreliable     Prior Function Level of Independence: Independent with assistive device(s)         Comments: Pt ambulated with SPC household distances.      Hand Dominance        Extremity/Trunk Assessment   Upper Extremity Assessment: Generalized weakness (gross MMT 3+/5 UE)  Lower Extremity Assessment: Generalized weakness (Gross MMT 3+/5 LE)         Communication   Communication: No difficulties (delayed response)  Cognition Arousal/Alertness: Awake/alert Behavior During Therapy: WFL for tasks assessed/performed Overall Cognitive Status: No family/caregiver present to determine baseline cognitive functioning (Oriented to person and place, not DOB, date/month, situation)                      General Comments      Exercises Other Exercises Other Exercises: Pt  performed bilateral therex x 12 reps at min assist for facilitation of movement. Exercises performed: ankle pumps, SLR, hip abd, LAQ, and sitting knee march      Assessment/Plan    PT Assessment Patient needs continued PT services  PT Diagnosis Difficulty walking;Abnormality of gait;Generalized weakness   PT Problem List Decreased strength;Decreased activity tolerance;Decreased cognition  PT Treatment Interventions DME instruction;Gait training;Stair training;Functional mobility training;Therapeutic activities;Therapeutic exercise;Balance training;Neuromuscular re-education   PT Goals (Current goals can be found in the Care Plan section) Acute Rehab PT Goals Patient Stated Goal: None stated  PT Goal Formulation: Patient unable to participate in goal setting Time For Goal Achievement: 04/19/15 Potential to Achieve Goals: Good    Frequency Min 2X/week   Barriers to discharge        Co-evaluation               End of Session Equipment Utilized During Treatment: Gait belt Activity Tolerance: Patient tolerated treatment well Patient left: in chair;with call bell/phone within reach;with chair alarm set Nurse Communication: Mobility status         Time: 2376-2831 PT Time Calculation (min) (ACUTE ONLY): 25 min   Charges:         PT G CodesJanyth Contes Apr 27, 2015, 11:24 AM  Janyth Contes, SPT. [phone]

## 2015-04-05 NOTE — Plan of Care (Signed)
Problem: Discharge Progression Outcomes Goal: Other Discharge Outcomes/Goals Outcome: Progressing Plan of care progress to goal: Pt on IV ABX. No c/o pain, PRN pain medicine ordered. Tolerating diet, fair appetite. Ensure ordered. Up to chair with PT. Incontinent, calls with needs.

## 2015-04-05 NOTE — Evaluation (Signed)
Occupational Therapy Evaluation Patient Details Name: Perel Hauschild MRN: [id number] DOB: 1924-07-04 Today's Date: 04/05/2015    History of Present Illness Pt was admitted to the hospital s/p fall while bending over. When at the hospital she was found to have UTI. Pt also suffered lacerations to R U/LE    Clinical Impression   This patient is an 81 year old female who came to [facility] with the above problem. She lives with her daughter and son in law, in a one story home  and was independent with dressing and toileting but needed assist with bathing. She now requires assist and would benefit from Occupational Therapy for ADL/functioal mobility training.     Follow Up Recommendations  SNF    Equipment Recommendations       Recommendations for Other Services       Precautions / Restrictions Precautions Precautions: Fall Restrictions Weight Bearing Restrictions: No      Mobility Bed Mobility Overal bed mobility: Modified Independent;Needs Assistance Bed Mobility: Supine to Sit;Sit to Supine     Supine to sit: Min assist Sit to supine: Min assist      Transfers     Transfers:  (sit to stand and stand to sit minimal assist)                Balance                                            ADL                                         General ADL Comments: . Patient had been independent with dressing and toileting but needed assist with bathing. She now needs moderate assist for lower body dressing and minimal assist for toilet transfer.     Vision     Perception     Praxis      Pertinent Vitals/Pain Pain Assessment: No/denies pain     Hand Dominance     Extremity/Trunk Assessment Upper Extremity Assessment Upper Extremity Assessment:  (Right shoulder limited to 140o, otherwise WFL, MMT R 4-/5 L 4+/5,  sensation to light touch and temp WNL.)   Lower Extremity Assessment Lower  Extremity Assessment: Defer to PT evaluation       Communication Communication Communication: No difficulties   Cognition Arousal/Alertness: Awake/alert Behavior During Therapy: WFL for tasks assessed/performed                       General Comments       Exercises       Shoulder Instructions      Home Living Family/patient expects to be discharged to:: Skilled nursing facility Living Arrangements: Children (lives with daughter and son in law.) Available Help at Discharge: Available PRN/intermittently Type of Home: House                           Additional Comments: Daughter and son in law present      Prior Functioning/Environment          Comments: Had been using a single point cane.  She dressed and toileted herself but got assist with baths.    OT Diagnosis: Generalized weakness  OT Problem List: Decreased strength;Decreased range of motion;Decreased activity tolerance;Decreased knowledge of use of DME or AE   OT Treatment/Interventions: Self-care/ADL training    OT Goals(Current goals can be found in the care plan section) Acute Rehab OT Goals Patient Stated Goal: willing to go to snf OT Goal Formulation: With patient/family Time For Goal Achievement: 04/19/15 Potential to Achieve Goals: Good  OT Frequency: Min 1X/week   Barriers to D/C:            Co-evaluation              End of Session Equipment Utilized During Treatment: Gait belt  Activity Tolerance:   Patient left: in bed;with call bell/phone within reach;with bed alarm set;with family/visitor present   Time: 1350-1410 OT Time Calculation (min): 20 min Charges:  OT General Charges $OT Visit: 1 Procedure OT Evaluation $Initial OT Evaluation Tier I: 1 Procedure G-Codes:    Myrene Galas, MS/OTR/L  04/05/2015, 3:33 PM

## 2015-04-06 DIAGNOSIS — R000 Tachycardia, unspecified: Secondary | ICD-10-CM | POA: Diagnosis present

## 2015-04-06 LAB — URINE CULTURE: Special Requests: NORMAL

## 2015-04-06 MED ORDER — CIPROFLOXACIN HCL 500 MG PO TABS: 500.0000 mg | ORAL_TABLET | Freq: Two times a day (BID) | ORAL | Status: DC

## 2015-04-06 MED ORDER — OXYCODONE-ACETAMINOPHEN 2.5-325 MG PO TABS: 1.0000 | ORAL_TABLET | ORAL | Status: DC | PRN

## 2015-04-06 MED ORDER — ENSURE ENLIVE PO LIQD: 237.0000 mL | ORAL | Status: DC

## 2015-04-06 NOTE — NC FL2 (Addendum)
[place] LEVEL OF CARE SCREENING TOOL     IDENTIFICATION  Patient Name: Dana Austin Birthdate: [birth year] Sex: female Admission Date (Current Location): 04/04/2015  [place] and [place] Number: Engineering geologist and Address:  [facility], [address] [place], [place] [address]      Provider Number: [id number]  Attending Physician Name and Address:  Epifanio Lesches, MD  Relative Name and Phone Number:       Current Level of Care: SNF Recommended Level of Care: [place] Prior Approval Number:    Date Approved/Denied:   PASRR Number:    Discharge Plan: SNF    Current Diagnoses: Patient Active Problem List   Diagnosis Date Noted  . Sepsis ([place]) 04/05/2015    Orientation ACTIVITIES/SOCIAL BLADDER RESPIRATION    Self, Time, Situation, Place  Family supportive Incontinent Normal  BEHAVIORAL SYMPTOMS/MOOD NEUROLOGICAL BOWEL NUTRITION STATUS      Continent    PHYSICIAN VISITS COMMUNICATION OF NEEDS Height & Weight Skin  30 days Verbally   103 lbs. Surgical wounds          AMBULATORY STATUS RESPIRATION    Assist extensive Normal      Personal Care Assistance Level of Assistance  Bathing, Dressing Bathing Assistance: Limited assistance   Dressing Assistance: Limited assistance      Functional Limitations Info                SPECIAL CARE FACTORS FREQUENCY  PT (By licensed PT)     PT Frequency: 5x week             Additional Factors Info  Allergies   Allergies Info: Meloxicam           Current Medications (04/06/2015): Current Facility-Administered Medications  Medication Dose Route Frequency Provider Last Rate Last Dose  . acetaminophen (TYLENOL) tablet 650 mg  650 mg Oral Q6H PRN Harrie Foreman, MD       Or  . acetaminophen (TYLENOL) suppository 650 mg  650 mg Rectal Q6H PRN Harrie Foreman, MD      . ALPRAZolam Duanne Moron) tablet 0.25 mg  0.25 mg Oral QHS PRN  Harrie Foreman, MD      . atorvastatin (LIPITOR) tablet 80 mg  80 mg Oral QHS Harrie Foreman, MD   40 mg at 04/05/15 2020  . cefTRIAXone (ROCEPHIN) 1 g in dextrose 5 % 50 mL IVPB  1 g Intravenous Q24H Paulette Blanch, MD 100 mL/hr at 04/06/15 0214 1 g at 04/06/15 0214  . docusate sodium (COLACE) capsule 100 mg  100 mg Oral BID Harrie Foreman, MD   100 mg at 04/05/15 2020  . feeding supplement (ENSURE ENLIVE) (ENSURE ENLIVE) liquid 237 mL  237 mL Oral Q24H Epifanio Lesches, MD   237 mL at 04/05/15 0900  . heparin injection 5,000 Units  5,000 Units Subcutaneous 3 times per day Harrie Foreman, MD   5,000 Units at 04/06/15 0528  . loratadine (CLARITIN) tablet 10 mg  10 mg Oral Daily Harrie Foreman, MD   10 mg at 04/05/15 1287  . metoprolol succinate (TOPROL-XL) 24 hr tablet 25 mg  25 mg Oral Daily Harrie Foreman, MD   25 mg at 04/05/15 8676  . morphine 2 MG/ML injection 1 mg  1 mg Intravenous Q4H PRN Harrie Foreman, MD      . morphine 2 MG/ML injection 2 mg  2 mg Intravenous Once Paulette Blanch, MD  Stopped at 04/05/15 0026  . ondansetron (ZOFRAN) tablet 4 mg  4 mg Oral Q6H PRN Harrie Foreman, MD       Or  . ondansetron [facility]) injection 4 mg  4 mg Intravenous Q6H PRN Harrie Foreman, MD      . pantoprazole (PROTONIX) EC tablet 40 mg  40 mg Oral QAC breakfast Harrie Foreman, MD   40 mg at 04/05/15 4650  . sodium chloride 0.9 % injection 3 mL  3 mL Intravenous Q12H Harrie Foreman, MD   3 mL at 04/05/15 2021   Do not use this list as official medication orders. Please verify with discharge summary.  Discharge Medications:   Medication List    TAKE these medications        docusate sodium 100 MG capsule  Commonly known as:  COLACE  Take 1 tablet once or twice daily as needed for constipation while taking strong pain medicine     traMADol 50 MG tablet  Commonly known as:  ULTRAM  Take 1-2 tablets by mouth every 6 hours as needed for moderate to severe pain       ASK your doctor about these medications        ALPRAZolam 0.25 MG tablet  Commonly known as:  XANAX  Take 0.25 mg by mouth at bedtime as needed for anxiety.     atorvastatin 80 MG tablet  Commonly known as:  LIPITOR  Take 80 mg by mouth daily.     loratadine 10 MG tablet  Commonly known as:  CLARITIN  Take 10 mg by mouth daily.     metoprolol succinate 25 MG 24 hr tablet  Commonly known as:  TOPROL-XL  Take 25 mg by mouth daily.     omeprazole 20 MG capsule  Commonly known as:  PRILOSEC  Take 20 mg by mouth daily.        Relevant Imaging Results:  Relevant Lab Results:  Recent Labs    Additional Royal, LCSW

## 2015-04-06 NOTE — Plan of Care (Signed)
Problem: Discharge Progression Outcomes Goal: Other Discharge Outcomes/Goals Outcome: Progressing No complaints of pain Hemodynamics: vital signs stable Complications: orthopedic cx pending- left pubic ramus fx  Diet: Pt tolerating ordered diet Activity: turned with assist in bed. Up during the day.

## 2015-04-06 NOTE — Progress Notes (Signed)
Called report to [facility].  Pt IV removed per policy and pt transported to [facility] via car by her daughter.  Clarise Cruz, RN

## 2015-04-06 NOTE — Discharge Summary (Signed)
Dana Austin, is a 81 y.o. female  DOB 20-Jun-1924  MRN [id number].  Admission date:  04/04/2015  Admitting Physician  Harrie Foreman, MD  Discharge Date:  04/06/2015   Primary MD  Tracie Harrier, MD  Recommendations for primary care physician for things to follow:   Follow-up with primary doctor in 1 week.  Admission Diagnosis  Laceration of right lower leg, initial encounter [S81.811A] Pubic ramus fracture, left, closed, initial encounter ([place]) [S32.592A] Skin tear of right forearm without complication, initial encounter [S51.801A]   Discharge Diagnosis  Laceration of right lower leg, initial encounter [S81.811A] Pubic ramus fracture, left, closed, initial encounter ([place]) [S32.592A] Skin tear of right forearm without complication, initial encounter [S51.801A]    Active Problems:   Sepsis [facility])      Past Medical History  Diagnosis Date  . Stroke ([place])   . COPD (chronic obstructive pulmonary disease) ([place])   . Subdural hematoma [facility])     Past Surgical History  Procedure Laterality Date  . None         History of present illness and  Hospital Course:     Kindly see H&P for history of present illness and admission details, please review complete Labs, Consult reports and Test reports for all details in brief  HPI  from the history and physical done on the day of admission 81 year old female patient admitted secondary to fall, ambulatory difficulty. X-ray of the hip showed hairline fracture of the left pubic ramus. Patient also found to have UTI and admission.   Hospital Course  #1. Ambulatory difficulty with the left pubic cram is fracture: Physical therapy recommended SNF placement, continue pain medications, physical therapy, patient is going to office today. Orthopedic is consulted at family  request but the consult is still pending , left pubic  Ramus fracture  Are treated conservatively,  Going to [facility] today, Sepsiswith evidence of tachycardia tachypnea on admission secondary to UTI; on Rocephin, urine culture showed mixed bacteria. We will give her Cipro to finish 5 days of antibiotics. Hypertension controlled   Discharge Condition: stable   Follow UP      Follow-up Information    Follow up with [facility], MD. Schedule an appointment as soon as possible for a visit in 2 days.   Specialty:  Internal Medicine   Contact information:   [place] [address] [phone]       Follow up with Corky Mull, MD. Schedule an appointment as soon as possible for a visit in 1 week.   Specialty:  Surgery   Why:  for your pelvic fractures   Contact information:   [place] [address] [phone]         Discharge Instructions  and  Discharge Medications        Medication List    TAKE these medications        ALPRAZolam 0.25 MG tablet  Commonly known as:  XANAX  Take 0.25 mg by mouth at bedtime as needed for anxiety.     atorvastatin 80 MG tablet  Commonly known as:  LIPITOR  Take 80 mg by mouth daily.     ciprofloxacin 500 MG tablet  Commonly known as:  CIPRO  Take 1 tablet (500 mg total) by mouth 2 (two) times daily.     docusate sodium 100 MG capsule  Commonly known as:  COLACE  Take 1 tablet once or twice daily as  needed for constipation while taking strong pain medicine     feeding supplement (ENSURE ENLIVE) Liqd  Take 237 mLs by mouth daily.     loratadine 10 MG tablet  Commonly known as:  CLARITIN  Take 10 mg by mouth daily.     metoprolol succinate 25 MG 24 hr tablet  Commonly known as:  TOPROL-XL  Take 25 mg by mouth daily.     omeprazole 20 MG capsule  Commonly known as:  PRILOSEC  Take 20 mg by mouth daily.     oxycodone-acetaminophen  2.5-325 MG tablet  Commonly known as:  PERCOCET  Take 1 tablet by mouth every 4 (four) hours as needed for pain.     traMADol 50 MG tablet  Commonly known as:  ULTRAM  Take 1-2 tablets by mouth every 6 hours as needed for moderate to severe pain          Diet and Activity recommendation: See Discharge Instructions above   Consults obtained - PT  Major procedures and Radiology Reports - PLEASE review detailed and final reports for all details, in brief -      Ct Abdomen Pelvis W Contrast  04/05/2015  CLINICAL DATA:  [place] yesterday after losing her balance. EXAM: CT ABDOMEN AND PELVIS WITH CONTRAST TECHNIQUE: Multidetector CT imaging of the abdomen and pelvis was performed using the standard protocol following bolus administration of intravenous contrast. CONTRAST:  64mL OMNIPAQUE IOHEXOL 300 MG/ML  SOLN COMPARISON:  None. FINDINGS: No acute fracture or other significant musculoskeletal lesion is evident. Moderately severe degenerative disc and facet changes are present in the lumbar spine. There are multiple hepatic cysts, unchanged by report since 09/19/2006. There are otherwise unremarkable appearances of the liver, spleen, pancreas, adrenals and kidneys. There is no peritoneal blood or free air. Bowel is remarkable for moderate uncomplicated colonic diverticulosis. The abdominal aorta is normal in caliber and intact, with extensive atherosclerotic calcification. Uterus and adnexal regions are unremarkable. Benign appearing atelectasis in the medial aspect of the left lower lobe, unchanged by report since 09/19/2006. IMPRESSION: No evidence of a significant acute traumatic injury in the abdomen or pelvis. No acute findings are evident. Moderate diverticulosis. Electronically Signed   By: Andreas Newport M.D.   On: 04/05/2015 02:38   Dg Chest Port 1 View  04/05/2015  CLINICAL DATA:  Tachycardia.  History of COPD. EXAM: PORTABLE CHEST 1 VIEW COMPARISON:  [facility] Oct 07, 2014 FINDINGS:  Cardiac silhouette is mildly enlarged. Mildly calcified aortic knob. Increased lung volumes, compatible with history of COPD. No pleural effusion or focal consolidation. Small nodular density projecting in the periphery of the RIGHT upper lobe. No pneumothorax. Patient is osteopenic. Soft tissue planes are normal. IMPRESSION: Mild cardiomegaly and COPD. No superimposed acute pulmonary process. Small nodular density projecting in periphery of RIGHT upper lobe, for which follow-up CT of the chest is recommended on a nonemergent basis. Electronically Signed   By: Elon Alas M.D.   On: 04/05/2015 01:56   Dg Hip Unilat With Pelvis 2-3 Views Left  04/04/2015  CLINICAL DATA:  Patient fell at home with left hip pain EXAM: DG HIP (WITH OR WITHOUT PELVIS) 2-3V LEFT COMPARISON:  None. FINDINGS: Diffuse osteopenia. Probable enchondroma or bone infarct greater trochanter on the right incidentally seen. Very subtle nondisplaced fractures. A mass on the left. Very subtle fracture of the left superior pubic ramus. No evidence of fracture or dislocation involving the left femur. IMPRESSION: Subtle fractures of the left pubic rami. Electronically Signed  By: Skipper Cliche M.D.   On: 04/04/2015 21:23    Micro Results     Recent Results (from the past 240 hour(s))  Urine culture     Status: None   Collection Time: 04/05/15  5:59 AM  Result Value Ref Range Status   Specimen Description URINE, RANDOM  Final   Special Requests Normal  Final   Culture MULTIPLE SPECIES PRESENT, SUGGEST RECOLLECTION  Final   Report Status 04/06/2015 FINAL  Final       Today   Subjective:   Dana Austin today has no headache,no chest abdominal pain,no new weakness tingling or numbness, going to [facility] today  Objective:   Blood pressure 141/60, pulse 78, temperature 98.6 F (37 C), temperature source Oral, resp. rate 18, height 5\' 2"  (1.575 m), weight 46.857 kg (103 lb 4.8 oz), SpO2 95 %.   Intake/Output Summary  (Last 24 hours) at 04/06/15 1307 Last data filed at 04/06/15 0800  Gross per 24 hour  Intake    240 ml  Output      0 ml  Net    240 ml    Exam Awake Alert, Oriented x 3, No new F.N deficits, Normal affect .AT,PERRAL Supple Neck,No JVD, No cervical lymphadenopathy appriciated.  Symmetrical Chest wall movement, Good air movement bilaterally, CTAB RRR,No Gallops,Rubs or new Murmurs, No Parasternal Heave +ve B.Sounds, Abd Soft, Non tender, No organomegaly appriciated, No rebound -guarding or rigidity. No Cyanosis, Clubbing or edema, No new Rash or bruise  Data Review   CBC w Diff:  Lab Results  Component Value Date   WBC 14.9* 04/05/2015   HGB 13.4 04/05/2015   HCT 41.3 04/05/2015   PLT 262 04/05/2015   LYMPHOPCT 12 04/05/2015   MONOPCT 6 04/05/2015   EOSPCT 1 04/05/2015   BASOPCT 2 04/05/2015    CMP:  Lab Results  Component Value Date   NA 141 04/05/2015   K 3.6 04/05/2015   CL 108 04/05/2015   CO2 24 04/05/2015   BUN 16 04/05/2015   CREATININE 0.71 04/05/2015   PROT 6.6 04/05/2015   ALBUMIN 4.0 04/05/2015   BILITOT 0.8 04/05/2015   ALKPHOS 106 04/05/2015   AST 31 04/05/2015   ALT 37 04/05/2015  .   Total Time in preparing paper work, data evaluation and todays exam - 74 minutes  Jaquilla Woodroof M.D on 04/06/2015 at 1:07 PM    Note: This dictation was prepared with Dragon dictation along with smaller phrase technology. Any transcriptional errors that result from this process are unintentional.

## 2015-04-06 NOTE — Progress Notes (Signed)
[facility] [place] NAME: Dana Austin    MR#:  [id number]  DATE OF BIRTH:  [birth year]  SUBJECTIVE:left  Leg pain with ambulation,  CHIEF COMPLAINT:   Chief Complaint  Patient presents with  . Fall    REVIEW OF SYSTEMS:   ROS CONSTITUTIONAL: No fever, fatigue or weakness.  EYES: No blurred or double vision.  EARS, NOSE, AND THROAT: No tinnitus or ear pain.  RESPIRATORY: No cough, shortness of breath, wheezing or hemoptysis.  CARDIOVASCULAR: No chest pain, orthopnea, edema.  GASTROINTESTINAL: No nausea, vomiting, diarrhea or abdominal pain.  GENITOURINARY: No dysuria, hematuria.  ENDOCRINE: No polyuria, nocturia,  HEMATOLOGY: No anemia, easy bruising or bleeding SKIN: No rash or lesion. MUSCULOSKELETAL: No joint pain or arthritis.   NEUROLOGIC: No tingling, numbness, weakness.  PSYCHIATRY: No anxiety or depression.   DRUG ALLERGIES:   Allergies  Allergen Reactions  . Meloxicam Shortness Of Breath    Pt is not aware she is allergic to this medication. She "becomes short of breath all the time"     VITALS:  Blood pressure 141/60, pulse 78, temperature 98.6 F (37 C), temperature source Oral, resp. rate 18, height 5\' 2"  (1.575 m), weight 46.857 kg (103 lb 4.8 oz), SpO2 95 %.  PHYSICAL EXAMINATION:  GENERAL:  81 y.o.-year-old patient lying in the bed with no acute distress.  EYES: Pupils equal, round, reactive to light and accommodation. No scleral icterus. Extraocular muscles intact.  HEENT: Head atraumatic, normocephalic. Oropharynx and nasopharynx clear.  NECK:  Supple, no jugular venous distention. No thyroid enlargement, no tenderness.  LUNGS: Normal breath sounds bilaterally, no wheezing, rales,rhonchi or crepitation. No use of accessory muscles of respiration.  CARDIOVASCULAR: S1, S2 normal. No murmurs, rubs, or gallops.  ABDOMEN: Soft, nontender, nondistended. Bowel sounds present. No organomegaly or mass.   EXTREMITIES: No pedal edema, cyanosis, or clubbing.  NEUROLOGIC: Cranial nerves II through XII are intact. Muscle strength 5/5 in all extremities. Sensation intact. Gait not checked.  PSYCHIATRIC: The patient is alert and oriented x 3.  SKIN: No obvious rash, lesion, or ulcer.    LABORATORY PANEL:   CBC  Recent Labs Lab 04/05/15 0023  WBC 14.9*  HGB 13.4  HCT 41.3  PLT 262   ------------------------------------------------------------------------------------------------------------------  Chemistries   Recent Labs Lab 04/05/15 0023  NA 141  K 3.6  CL 108  CO2 24  GLUCOSE 137*  BUN 16  CREATININE 0.71  CALCIUM 9.3  AST 31  ALT 37  ALKPHOS 106  BILITOT 0.8   ------------------------------------------------------------------------------------------------------------------  Cardiac Enzymes  Recent Labs Lab 04/05/15 0023  TROPONINI 0.03   ------------------------------------------------------------------------------------------------------------------  RADIOLOGY:  Ct Abdomen Pelvis W Contrast  04/05/2015  CLINICAL DATA:  [place] yesterday after losing her balance. EXAM: CT ABDOMEN AND PELVIS WITH CONTRAST TECHNIQUE: Multidetector CT imaging of the abdomen and pelvis was performed using the standard protocol following bolus administration of intravenous contrast. CONTRAST:  17mL OMNIPAQUE IOHEXOL 300 MG/ML  SOLN COMPARISON:  None. FINDINGS: No acute fracture or other significant musculoskeletal lesion is evident. Moderately severe degenerative disc and facet changes are present in the lumbar spine. There are multiple hepatic cysts, unchanged by report since 09/19/2006. There are otherwise unremarkable appearances of the liver, spleen, pancreas, adrenals and kidneys. There is no peritoneal blood or free air. Bowel is remarkable for moderate uncomplicated colonic diverticulosis. The abdominal aorta is normal in caliber and intact, with extensive atherosclerotic  calcification. Uterus and adnexal regions are unremarkable. Benign  appearing atelectasis in the medial aspect of the left lower lobe, unchanged by report since 09/19/2006. IMPRESSION: No evidence of a significant acute traumatic injury in the abdomen or pelvis. No acute findings are evident. Moderate diverticulosis. Electronically Signed   By: Andreas Newport M.D.   On: 04/05/2015 02:38   Dg Chest Port 1 View  04/05/2015  CLINICAL DATA:  Tachycardia.  History of COPD. EXAM: PORTABLE CHEST 1 VIEW COMPARISON:  Esophagram Oct 07, 2014 FINDINGS: Cardiac silhouette is mildly enlarged. Mildly calcified aortic knob. Increased lung volumes, compatible with history of COPD. No pleural effusion or focal consolidation. Small nodular density projecting in the periphery of the RIGHT upper lobe. No pneumothorax. Patient is osteopenic. Soft tissue planes are normal. IMPRESSION: Mild cardiomegaly and COPD. No superimposed acute pulmonary process. Small nodular density projecting in periphery of RIGHT upper lobe, for which follow-up CT of the chest is recommended on a nonemergent basis. Electronically Signed   By: Elon Alas M.D.   On: 04/05/2015 01:56   Dg Hip Unilat With Pelvis 2-3 Views Left  04/04/2015  CLINICAL DATA:  Patient fell at home with left hip pain EXAM: DG HIP (WITH OR WITHOUT PELVIS) 2-3V LEFT COMPARISON:  None. FINDINGS: Diffuse osteopenia. Probable enchondroma or bone infarct greater trochanter on the right incidentally seen. Very subtle nondisplaced fractures. A mass on the left. Very subtle fracture of the left superior pubic ramus. No evidence of fracture or dislocation involving the left femur. IMPRESSION: Subtle fractures of the left pubic rami. Electronically Signed   By: Skipper Cliche M.D.   On: 04/04/2015 21:23    EKG:   Orders placed or performed during the hospital encounter of 04/04/15  . ED EKG  . ED EKG  . EKG 12-Lead  . EKG 12-Lead    ASSESSMENT AND PLAN:   #1 UTI with  sepsis: Present on admission. Patient had tachycardia, tachypnea leukocytosis  admission . improved with IV hydration, continue IV Rocephin, urine cultures showed mixed bacteria.. #2 generalized weakness likely due to UTI: Patient the has ambulatory  Difficulties,  #3 history of COPD stable no wheezing. #4 hypertension controlled  X-ray of the left hip showed subtle  fracture of left pubic ramus: Patient's family is concerned;  Will request orthopedic consult. Snf recommended by physical therapy. Continue pain medications.  Medically stable for  Discharge to SNF< All the records are reviewed and case discussed with Care Management/Social Workerr. Management plans discussed with the patient, family and they are in agreement.  CODE STATUS: Full  TOTAL TIME TAKING CARE OF THIS PATIENT: 40minutes.   POSSIBLE D/C IN 1-2 DAYS, DEPENDING ON CLINICAL CONDITION.   Epifanio Lesches M.D on 04/06/2015 at 12:51 PM  Between 7am to 6pm - Pager - [phone]  After 6pm go to [email or web address] - password [email or web address] [facility]  Office  [phone]  CC: Primary care physician; Tracie Harrier, MD   Note: This dictation was prepared with Dragon dictation along with smaller phrase technology. Any transcriptional errors that result from this process are unintentional.

## 2015-04-15 NOTE — Progress Notes (Signed)
04/05/15 1111  PT Visit Information  Last PT Received On 04/05/15  History of Present Illness Pt was admitted to the hospital s/p fall while bending over. When at the hospital she was found to have UTI. Pt also suffered lacerations to R U/LE   Precautions  Precautions Fall  Restrictions  Weight Bearing Restrictions No  Home Living  Family/patient expects to be discharged to: Private residence  Living Arrangements Children (Lives with daughter)  Available Help at Discharge Available PRN/intermittently  Type of Home (Obtain hx from daughter)  Additional Comments Pt cognition makes hx potentially unreliable   Prior Function  Level of Independence Independent with assistive device(s)  Comments Pt ambulated with [facility] household distances.   Communication  Communication No difficulties (delayed response)  Pain Assessment  Pain Assessment No/denies pain  Cognition  Arousal/Alertness Awake/alert  Behavior During Therapy WFL for tasks assessed/performed  Overall Cognitive Status No family/caregiver present to determine baseline cognitive functioning (Oriented to person and place, not DOB, date/month, situation)  Upper Extremity Assessment  Upper Extremity Assessment Generalized weakness (gross MMT 3+/5 UE)  Lower Extremity Assessment  Lower Extremity Assessment Generalized weakness (Gross MMT 3+/5 LE)  Bed Mobility  Overal bed mobility Modified Independent;Needs Assistance  Bed Mobility Supine to Sit;Sit to Supine  Supine to sit Min assist  Sit to supine Min assist  General bed mobility comments Pt requires minor assist for trunk. Good management of trunk and LEs  Transfers  Overall transfer level Needs assistance  Equipment used Rolling walker (2 wheeled)  Transfers Sit to/from Stand  Sit to Stand Min assist  General transfer comment Needs minor assist for getting into standing. She also needs cues on appropriate use of hands with RW. Pt relatively stable once standing. No LOB  or unsteadiness   Ambulation/Gait  Ambulation/Gait assistance Min assist  Ambulation Distance (Feet) 20 Feet  Assistive device Rolling walker (2 wheeled)  Gait Pattern/deviations Step-to pattern;Decreased step length - right;Decreased step length - left;Decreased stride length;Shuffle  General Gait Details Pt with greatly reduced step lenghth and shuffle pattern. She can take slighly larger steps when cued to do so but her gait speed remains very slow. She states fatigue after 15 ft of ambulation   Gait velocity greatly decreased  Gait velocity interpretation <1.8 ft/sec, indicative of risk for recurrent falls  Balance  Overall balance assessment History of Falls  Exercises  Exercises Other exercises  Other Exercises  Other Exercises Pt performed bilateral therex x 12 reps at min assist for facilitation of movement. Exercises performed: ankle pumps, SLR, hip abd, LAQ, and sitting knee march  PT - End of Session  Equipment Utilized During Treatment Gait belt  Activity Tolerance Patient tolerated treatment well  Patient left in chair;with call bell/phone within reach;with chair alarm set  Nurse Communication Mobility status  PT Assessment  PT Therapy Diagnosis  Difficulty walking;Abnormality of gait;Generalized weakness  PT Recommendation/Assessment Patient needs continued PT services  PT Problem List Decreased strength;Decreased activity tolerance;Decreased cognition  PT Plan  PT Frequency (ACUTE ONLY) Min 2X/week  PT Treatment/Interventions (ACUTE ONLY) DME instruction;Gait training;Stair training;Functional mobility training;Therapeutic activities;Therapeutic exercise;Balance training;Neuromuscular re-education  PT Recommendation  Follow Up Recommendations SNF  PT equipment (TBD)  Individuals Consulted  Consulted and Agree with Results and Recommendations Patient  Acute Rehab PT Goals  Patient Stated Goal None stated   PT Goal Formulation Patient unable to participate in goal  setting  Time For Goal Achievement 04/19/15  Potential to Achieve Goals Good  PT  Time Calculation  PT Start Time (ACUTE ONLY) 0915  PT Stop Time (ACUTE ONLY) 0940  PT Time Calculation (min) (ACUTE ONLY) 25 min  PT G-Codes **NOT FOR INPATIENT CLASS**  Functional Assessment Tool Used gait speed  Functional Limitation Mobility: Walking and moving around  Mobility: Walking and Moving Around Current Status [id number]) CJ  Mobility: Walking and Moving Around Goal Status [id number]) CI  PT General Charges  $$ ACUTE PT VISIT 1 Procedure  PT Evaluation  $Initial PT Evaluation Tier I 1 Procedure  PT Treatments  $Therapeutic Exercise 8-22 mins  Late G codes entered for 04/18/15  Greggory Stallion, PT, DPT [phone]

## 2015-06-08 DIAGNOSIS — S80811D Abrasion, right lower leg, subsequent encounter: Secondary | ICD-10-CM | POA: Diagnosis not present

## 2015-06-08 DIAGNOSIS — S32592D Other specified fracture of left pubis, subsequent encounter for fracture with routine healing: Secondary | ICD-10-CM | POA: Diagnosis not present

## 2015-06-08 DIAGNOSIS — Z85828 Personal history of other malignant neoplasm of skin: Secondary | ICD-10-CM | POA: Diagnosis not present

## 2015-06-08 DIAGNOSIS — J449 Chronic obstructive pulmonary disease, unspecified: Secondary | ICD-10-CM | POA: Diagnosis not present

## 2015-06-08 DIAGNOSIS — Z8673 Personal history of transient ischemic attack (TIA), and cerebral infarction without residual deficits: Secondary | ICD-10-CM | POA: Diagnosis not present

## 2015-06-08 DIAGNOSIS — M6281 Muscle weakness (generalized): Secondary | ICD-10-CM | POA: Diagnosis not present

## 2015-06-08 DIAGNOSIS — Z9181 History of falling: Secondary | ICD-10-CM | POA: Diagnosis not present

## 2015-06-08 DIAGNOSIS — I10 Essential (primary) hypertension: Secondary | ICD-10-CM | POA: Diagnosis not present

## 2015-06-16 ENCOUNTER — Ambulatory Visit
Admission: EM | Admit: 2015-06-16 | Discharge: 2015-06-16 | Disposition: A | Discharge status: Home / Self Care | Payer: PPO | Attending: Family Medicine | Admitting: Family Medicine

## 2015-06-16 ENCOUNTER — Encounter: Payer: Self-pay | Admitting: Emergency Medicine

## 2015-06-16 DIAGNOSIS — N390 Urinary tract infection, site not specified: Secondary | ICD-10-CM

## 2015-06-16 DIAGNOSIS — R739 Hyperglycemia, unspecified: Secondary | ICD-10-CM

## 2015-06-16 LAB — URINALYSIS COMPLETE WITH MICROSCOPIC (ARMC ONLY)
Bilirubin Urine: NEGATIVE
Glucose, UA: 500 mg/dL — AB
KETONES UR: NEGATIVE mg/dL
Nitrite: NEGATIVE
PH: 5.5 (ref 5.0–8.0)
PROTEIN: 30 mg/dL — AB
Specific Gravity, Urine: 1.02 (ref 1.005–1.030)

## 2015-06-16 LAB — GLUCOSE, CAPILLARY: Glucose-Capillary: 308 mg/dL — ABNORMAL HIGH (ref 65–99)

## 2015-06-16 MED ORDER — SULFAMETHOXAZOLE-TRIMETHOPRIM 800-160 MG PO TABS: 1.0000 | ORAL_TABLET | Freq: Two times a day (BID) | ORAL | Status: AC

## 2015-06-16 NOTE — ED Notes (Signed)
Patient states she is having Urinary frequency and symptoms like she had when she had a UTI before.

## 2015-06-16 NOTE — ED Provider Notes (Signed)
CSN: [id number]     Arrival date & time 06/16/15  1219 History   First MD Initiated Contact with Patient 06/16/15 1433     Chief Complaint  Patient presents with  . Urinary Frequency   (Consider location/radiation/quality/duration/timing/severity/associated sxs/prior Treatment) HPI : Patient presents today with symptoms of urinary frequency/urgency. Patient states that she's had the symptoms for the last few days. She states that her symptoms are similar to when she had a UTI before. She denies any fever, hematuria, flank pain, abdominal pain, nausea, vomiting, headache. Patient minutes that she had a blueberry muffin, banana prior to coming to the office today. She has no history of diabetes.   Past Medical History  Diagnosis Date  . Stroke ([place])   . Subdural hematoma [facility])    Past Surgical History  Procedure Laterality Date  . None     Family History  Problem Relation Age of Onset  . CAD Brother   . Diabetes Mellitus II Mother    Social History  Substance Use Topics  . Smoking status: Never Smoker   . Smokeless tobacco: Never Used  . Alcohol Use: No     Comment: occasional   OB History    No data available     Review of Systems  Allergies  Meloxicam  Home Medications   Prior to Admission medications   Medication Sig Start Date End Date Taking? Authorizing Provider  ALPRAZolam Duanne Moron) 0.25 MG tablet Take 0.25 mg by mouth at bedtime as needed for anxiety.   Yes Historical Provider, MD  atorvastatin (LIPITOR) 80 MG tablet Take 80 mg by mouth daily. 03/01/15  Yes Historical Provider, MD  feeding supplement, ENSURE ENLIVE, (ENSURE ENLIVE) LIQD Take 237 mLs by mouth daily. 04/06/15  Yes Epifanio Lesches, MD  loratadine (CLARITIN) 10 MG tablet Take 10 mg by mouth daily.   Yes Historical Provider, MD  metoprolol succinate (TOPROL-XL) 25 MG 24 hr tablet Take 25 mg by mouth daily.   Yes Historical Provider, MD  omeprazole (PRILOSEC) 20 MG capsule Take 20 mg by mouth daily.    Yes Historical Provider, MD  ciprofloxacin (CIPRO) 500 MG tablet Take 1 tablet (500 mg total) by mouth 2 (two) times daily. 04/06/15   Epifanio Lesches, MD  docusate sodium (COLACE) 100 MG capsule Take 1 tablet once or twice daily as needed for constipation while taking strong pain medicine 04/04/15   Hinda Kehr, MD  oxycodone-acetaminophen (PERCOCET) 2.5-325 MG tablet Take 1 tablet by mouth every 4 (four) hours as needed for pain. 04/06/15   Epifanio Lesches, MD  traMADol (ULTRAM) 50 MG tablet Take 1-2 tablets by mouth every 6 hours as needed for moderate to severe pain 04/04/15   Hinda Kehr, MD   Meds Ordered and Administered this Visit  Medications - No data to display  BP 124/65 mmHg  Pulse 92  Temp(Src) 98 F (36.7 C) (Oral)  Resp 16  Ht 5\' 2"  (1.575 m)  Wt 100 lb (45.36 kg)  BMI 18.29 kg/m2  SpO2 97% No data found.   Physical Exam  GENERAL: NAD HEENT: no pharyngeal erythema, no exudate RESP: CTA B CARD: RRR ABD: +BS, NT/ND, no flank tenderness  NEURO: AAO x 3   ED Course  Procedures (including critical care time)  Labs Review Labs Reviewed  URINALYSIS COMPLETEWITH MICROSCOPIC ([facility] ONLY) - Abnormal; Notable for the following:    Color, Urine AMBER (*)    APPearance CLOUDY (*)    Glucose, UA 500 (*)    Hgb  urine dipstick 2+ (*)    Protein, ur 30 (*)    Leukocytes, UA 1+ (*)    Bacteria, UA MANY (*)    Squamous Epithelial / LPF 0-5 (*)    All other components within normal limits    Imaging Review No results found.       MDM  1) UTI-patient given Bactrim DS 7 days, encourage rest, hydration, urine culture sent to lab. If symptoms do persist or worsen she is to seek medical attention. 2) Hyperglycemia-discussed with patient that her fingerstick blood sugar is high enough to be concerned about diabetes, patient has no prior diagnosis of diabetes, will follow up with primary care physician this week, discussed changing diet and drinking more water.  Patient and family member addresses understanding.    Paulina Fusi, MD 06/16/15 [phone]

## 2015-06-17 DIAGNOSIS — R739 Hyperglycemia, unspecified: Secondary | ICD-10-CM | POA: Diagnosis not present

## 2015-06-17 DIAGNOSIS — I10 Essential (primary) hypertension: Secondary | ICD-10-CM | POA: Diagnosis not present

## 2015-06-17 DIAGNOSIS — J42 Unspecified chronic bronchitis: Secondary | ICD-10-CM | POA: Diagnosis not present

## 2015-06-17 DIAGNOSIS — N390 Urinary tract infection, site not specified: Secondary | ICD-10-CM | POA: Diagnosis not present

## 2015-06-17 DIAGNOSIS — Z8673 Personal history of transient ischemic attack (TIA), and cerebral infarction without residual deficits: Secondary | ICD-10-CM | POA: Diagnosis not present

## 2015-06-19 ENCOUNTER — Telehealth: Payer: Self-pay

## 2015-06-19 LAB — URINE CULTURE
Culture: >=100000
Special Requests: NORMAL

## 2015-06-19 NOTE — ED Notes (Signed)
[facility] called [facility] to state patient has + E Coli/ESBL urine. On Bactrim which is resistant.

## 2015-06-19 NOTE — ED Notes (Signed)
Dana Hatchet NP reviewed patient chart. Patient to stop Bactrim and start Cipro 500 mg. Take 1 po bid x 7 days. No refills. Called to [facility] in [place] and daughter Dana Austin notified re results.

## 2015-07-16 DIAGNOSIS — J42 Unspecified chronic bronchitis: Secondary | ICD-10-CM | POA: Diagnosis not present

## 2015-07-16 DIAGNOSIS — J40 Bronchitis, not specified as acute or chronic: Secondary | ICD-10-CM | POA: Diagnosis not present

## 2015-07-16 DIAGNOSIS — R079 Chest pain, unspecified: Secondary | ICD-10-CM | POA: Diagnosis not present

## 2015-09-20 DIAGNOSIS — K219 Gastro-esophageal reflux disease without esophagitis: Secondary | ICD-10-CM | POA: Diagnosis not present

## 2015-09-20 DIAGNOSIS — I10 Essential (primary) hypertension: Secondary | ICD-10-CM | POA: Diagnosis not present

## 2015-09-20 DIAGNOSIS — R55 Syncope and collapse: Secondary | ICD-10-CM | POA: Diagnosis not present

## 2015-09-20 DIAGNOSIS — R296 Repeated falls: Secondary | ICD-10-CM | POA: Diagnosis not present

## 2015-09-20 DIAGNOSIS — J42 Unspecified chronic bronchitis: Secondary | ICD-10-CM | POA: Diagnosis not present

## 2015-09-23 DIAGNOSIS — J42 Unspecified chronic bronchitis: Secondary | ICD-10-CM | POA: Diagnosis not present

## 2015-09-23 DIAGNOSIS — N390 Urinary tract infection, site not specified: Secondary | ICD-10-CM | POA: Diagnosis not present

## 2015-09-23 DIAGNOSIS — K219 Gastro-esophageal reflux disease without esophagitis: Secondary | ICD-10-CM | POA: Diagnosis not present

## 2015-09-23 DIAGNOSIS — Z8673 Personal history of transient ischemic attack (TIA), and cerebral infarction without residual deficits: Secondary | ICD-10-CM | POA: Diagnosis not present

## 2015-09-23 DIAGNOSIS — M810 Age-related osteoporosis without current pathological fracture: Secondary | ICD-10-CM | POA: Diagnosis not present

## 2015-09-23 DIAGNOSIS — I10 Essential (primary) hypertension: Secondary | ICD-10-CM | POA: Diagnosis not present

## 2016-02-23 DIAGNOSIS — Z0000 Encounter for general adult medical examination without abnormal findings: Secondary | ICD-10-CM | POA: Diagnosis not present

## 2016-02-23 DIAGNOSIS — J42 Unspecified chronic bronchitis: Secondary | ICD-10-CM | POA: Diagnosis not present

## 2016-02-23 DIAGNOSIS — H918X3 Other specified hearing loss, bilateral: Secondary | ICD-10-CM | POA: Diagnosis not present

## 2016-02-23 DIAGNOSIS — J310 Chronic rhinitis: Secondary | ICD-10-CM | POA: Diagnosis not present

## 2016-02-23 DIAGNOSIS — I10 Essential (primary) hypertension: Secondary | ICD-10-CM | POA: Diagnosis not present

## 2016-03-14 DIAGNOSIS — H353131 Nonexudative age-related macular degeneration, bilateral, early dry stage: Secondary | ICD-10-CM | POA: Diagnosis not present

## 2016-03-23 DIAGNOSIS — H6123 Impacted cerumen, bilateral: Secondary | ICD-10-CM | POA: Diagnosis not present

## 2016-03-23 DIAGNOSIS — H903 Sensorineural hearing loss, bilateral: Secondary | ICD-10-CM | POA: Diagnosis not present

## 2016-04-10 DIAGNOSIS — K219 Gastro-esophageal reflux disease without esophagitis: Secondary | ICD-10-CM | POA: Diagnosis not present

## 2016-04-10 DIAGNOSIS — I10 Essential (primary) hypertension: Secondary | ICD-10-CM | POA: Diagnosis not present

## 2016-04-10 DIAGNOSIS — Z8673 Personal history of transient ischemic attack (TIA), and cerebral infarction without residual deficits: Secondary | ICD-10-CM | POA: Diagnosis not present

## 2016-04-10 DIAGNOSIS — J42 Unspecified chronic bronchitis: Secondary | ICD-10-CM | POA: Diagnosis not present

## 2016-04-10 DIAGNOSIS — R0602 Shortness of breath: Secondary | ICD-10-CM | POA: Diagnosis not present

## 2016-05-04 DIAGNOSIS — J449 Chronic obstructive pulmonary disease, unspecified: Secondary | ICD-10-CM | POA: Diagnosis not present

## 2016-07-04 DIAGNOSIS — L570 Actinic keratosis: Secondary | ICD-10-CM | POA: Diagnosis not present

## 2016-07-04 DIAGNOSIS — X32XXXA Exposure to sunlight, initial encounter: Secondary | ICD-10-CM | POA: Diagnosis not present

## 2016-07-04 DIAGNOSIS — Z08 Encounter for follow-up examination after completed treatment for malignant neoplasm: Secondary | ICD-10-CM | POA: Diagnosis not present

## 2016-07-04 DIAGNOSIS — Z85828 Personal history of other malignant neoplasm of skin: Secondary | ICD-10-CM | POA: Diagnosis not present

## 2016-09-20 DIAGNOSIS — I10 Essential (primary) hypertension: Secondary | ICD-10-CM | POA: Diagnosis not present

## 2016-09-20 DIAGNOSIS — R7301 Impaired fasting glucose: Secondary | ICD-10-CM | POA: Diagnosis not present

## 2016-09-20 DIAGNOSIS — R21 Rash and other nonspecific skin eruption: Secondary | ICD-10-CM | POA: Diagnosis not present

## 2016-09-20 DIAGNOSIS — J449 Chronic obstructive pulmonary disease, unspecified: Secondary | ICD-10-CM | POA: Diagnosis not present

## 2016-10-24 IMAGING — CT CT HEAD W/O CM
1 of 2 series · 13 of 30 positions shown, 17 images · non-contrast
Comparison: None.

CLINICAL DATA: Fall x3. On 11/06/2014 the patient hit her head on
the right side. Subarachnoid hemorrhage.

EXAM:
CT HEAD WITHOUT CONTRAST
TECHNIQUE: Contiguous axial images were obtained from the base of the skull
through the vertex without intravenous contrast.

[Series 2: head wo · axial · 0.39mm/px · z∈[-72,+48]mm · 13 of 30 slices shown, 17 images]
[im 3/30  brain]
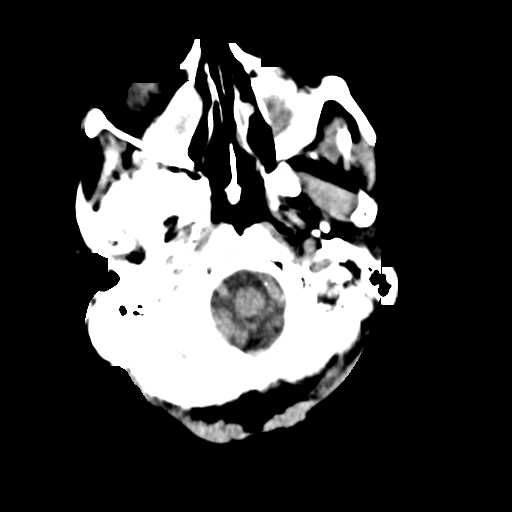
[im 3/30  bone]
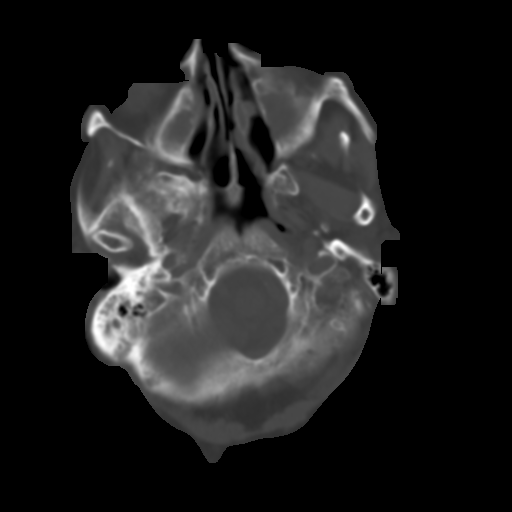
[im 5/30  brain]
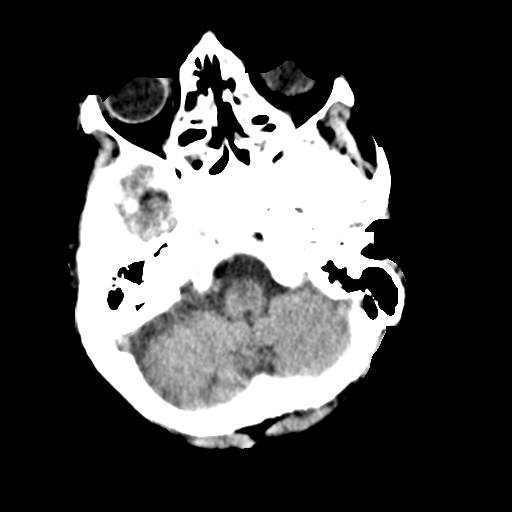
[im 7/30  brain]
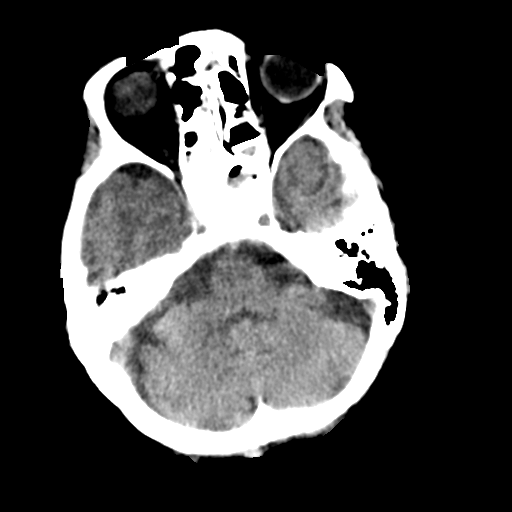
[im 9/30  brain]
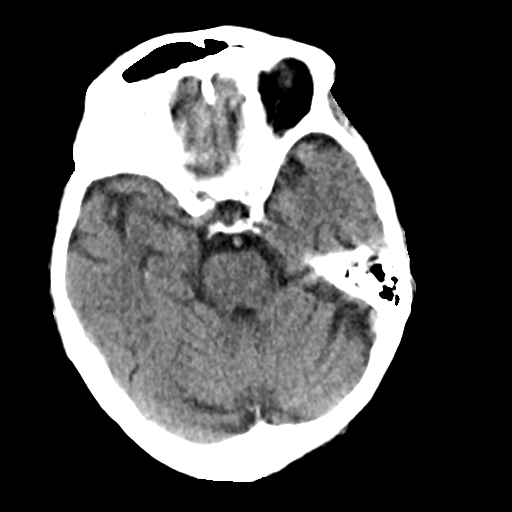
[im 11/30  brain]
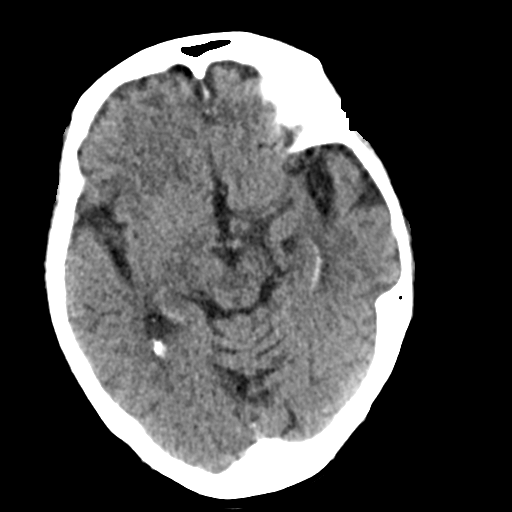
[im 11/30  bone]
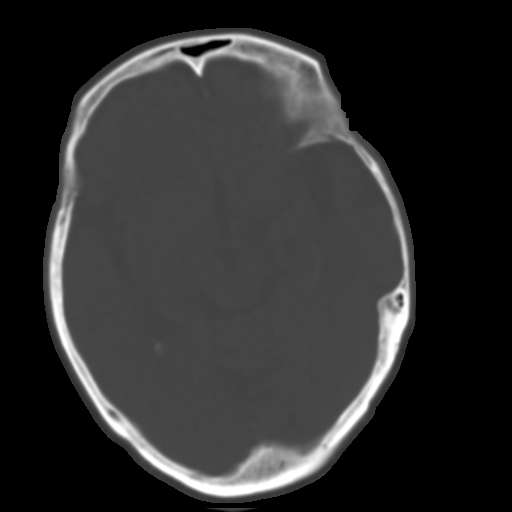
[im 13/30  brain]
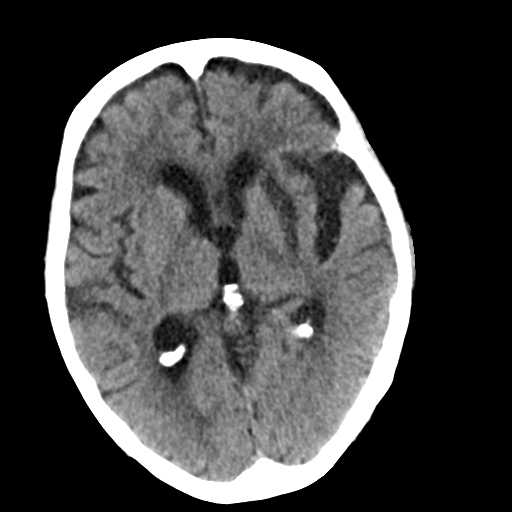
[im 15/30  brain]
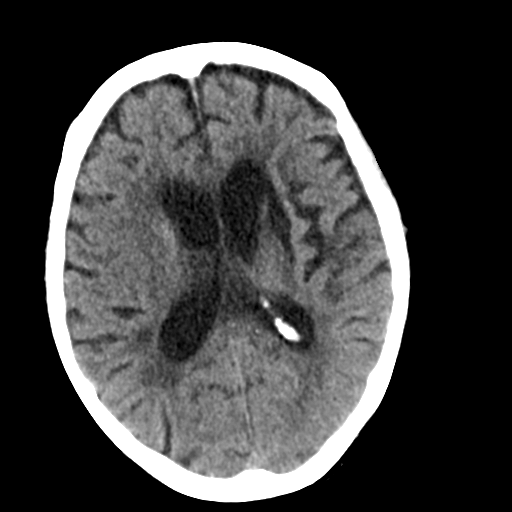
[im 17/30  brain]
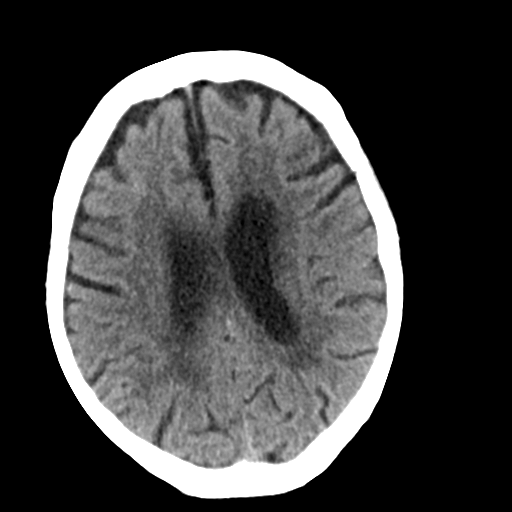
[im 19/30  brain]
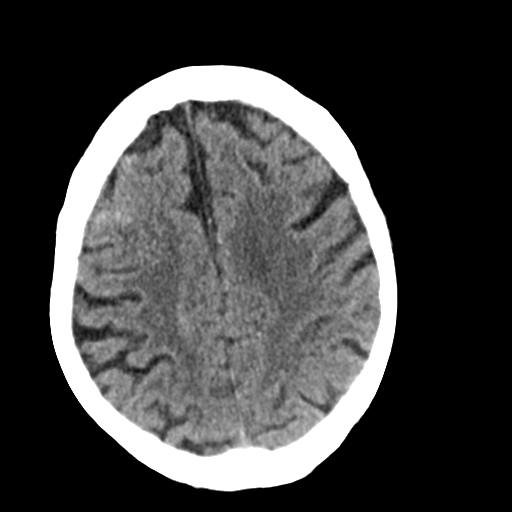
[im 19/30  bone]
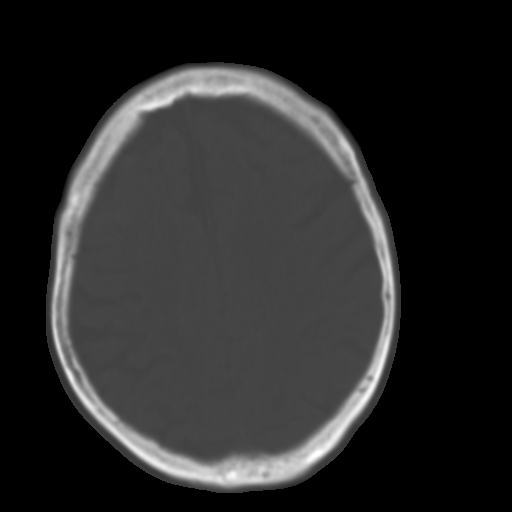
[im 21/30  brain]
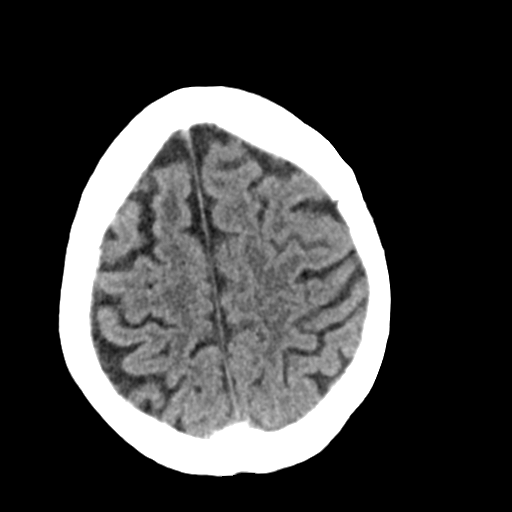
[im 23/30  brain]
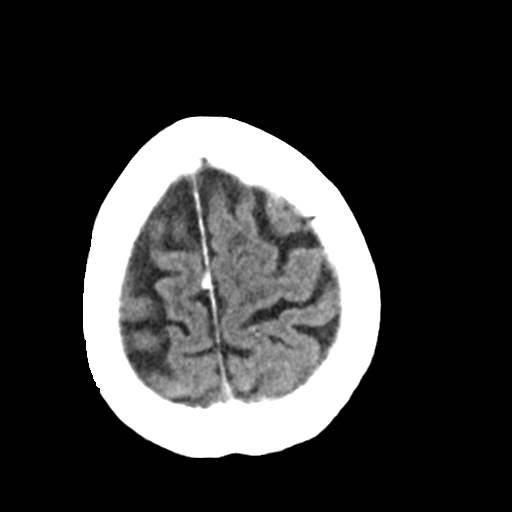
[im 25/30  brain]
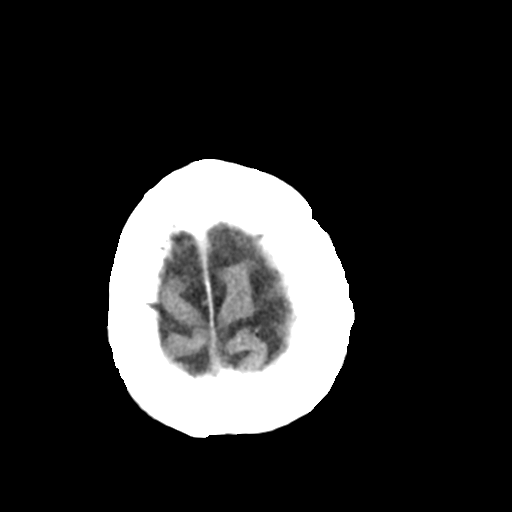
[im 27/30  brain]
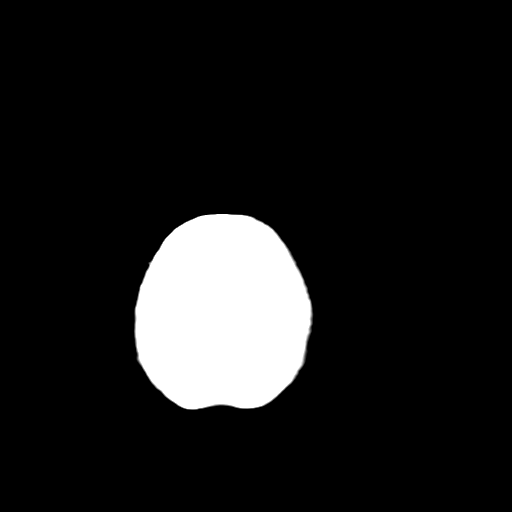
[im 27/30  bone]
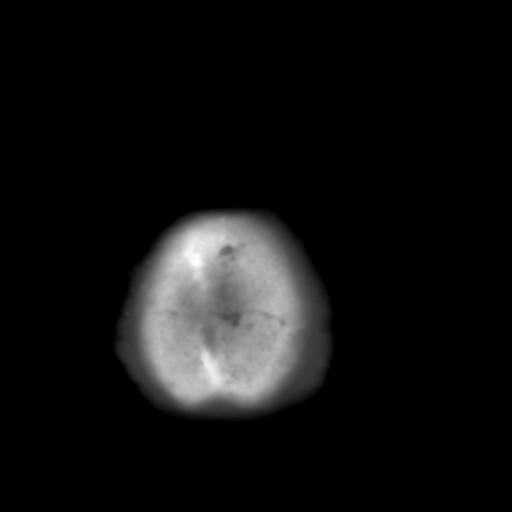

[13 of 30 positions shown; findings below may reference images not displayed]

FINDINGS: Skull and Sinuses:No indication of acute or remote fracture. Mild
skin irregularity in the right supraorbital region could reflect a
laceration.

Extensive chronic sinusitis with diffuse thick mucosal edema and
sclerotic wall thickening. The bilateral maxillary sinuses are
completely opacified where visualized, as is the right sphenoid
sinus. The only spared sinus is right frontal. No destructive
changes noted.

Sclerosis and partial opacification of right mastoid air cells.

Orbits: Bilateral cataract resection.  No traumatic findings.

Brain: There is trace subarachnoid hemorrhage at the high right
frontal convexity, series 2, image 9. Performing technologist
obtained verbal history from ordering physician's office which
confirms recent subarachnoid hemorrhage in this location related to
fall.

There is generalized atrophy which is moderate. Chronic small vessel
disease with patchy ischemic gliosis in the bilateral cerebral white
matter and a remote perforator type infarct affecting the left
putamen, caudate, and deep white matter tracts. No evidence of mass
lesion, acute infarct, or hydrocephalus.
IMPRESSION: 1. Trace right frontal subarachnoid hemorrhage, only present within
1 sulcus, reportedly known from recent imaging.
2. Atrophy and chronic small vessel disease.
3. Advanced chronic sinusitis.

## 2016-10-31 DIAGNOSIS — D485 Neoplasm of uncertain behavior of skin: Secondary | ICD-10-CM | POA: Diagnosis not present

## 2016-10-31 DIAGNOSIS — C44311 Basal cell carcinoma of skin of nose: Secondary | ICD-10-CM | POA: Diagnosis not present

## 2016-11-03 ENCOUNTER — Ambulatory Visit: Payer: PPO | Admitting: Podiatry

## 2016-11-14 DIAGNOSIS — R7301 Impaired fasting glucose: Secondary | ICD-10-CM | POA: Diagnosis not present

## 2016-11-14 DIAGNOSIS — I10 Essential (primary) hypertension: Secondary | ICD-10-CM | POA: Diagnosis not present

## 2016-11-14 DIAGNOSIS — J449 Chronic obstructive pulmonary disease, unspecified: Secondary | ICD-10-CM | POA: Diagnosis not present

## 2016-11-14 DIAGNOSIS — R21 Rash and other nonspecific skin eruption: Secondary | ICD-10-CM | POA: Diagnosis not present

## 2016-11-21 DIAGNOSIS — K219 Gastro-esophageal reflux disease without esophagitis: Secondary | ICD-10-CM | POA: Diagnosis not present

## 2016-11-21 DIAGNOSIS — Z0000 Encounter for general adult medical examination without abnormal findings: Secondary | ICD-10-CM | POA: Diagnosis not present

## 2016-11-21 DIAGNOSIS — J449 Chronic obstructive pulmonary disease, unspecified: Secondary | ICD-10-CM | POA: Diagnosis not present

## 2016-11-21 DIAGNOSIS — I10 Essential (primary) hypertension: Secondary | ICD-10-CM | POA: Diagnosis not present

## 2016-11-21 DIAGNOSIS — Z8673 Personal history of transient ischemic attack (TIA), and cerebral infarction without residual deficits: Secondary | ICD-10-CM | POA: Diagnosis not present

## 2016-11-23 ENCOUNTER — Ambulatory Visit (INDEPENDENT_AMBULATORY_CARE_PROVIDER_SITE_OTHER): Payer: PPO | Admitting: Podiatry

## 2016-11-23 ENCOUNTER — Encounter: Payer: Self-pay | Admitting: Podiatry

## 2016-11-23 DIAGNOSIS — M79676 Pain in unspecified toe(s): Secondary | ICD-10-CM

## 2016-11-23 DIAGNOSIS — B351 Tinea unguium: Secondary | ICD-10-CM | POA: Diagnosis not present

## 2016-11-23 NOTE — Progress Notes (Signed)
   Subjective:    Patient ID: Dana Austin, female    DOB: 1925/03/22, 83 y.o.   MRN: [id number]  HPI this. This patient presents to the office with chief complaint of long painful nails. She states that the nails are painful walking and wearing her shoes. She says she desires to have all her nails trimmed with the exception of the toenail on the right big toe.  She says her daughter will do that soon.  She presents the office today for preventative foot care services    Review of Systems  All other systems reviewed and are negative.      Objective:   Physical Exam GENERAL APPEARANCE: Alert, conversant. Appropriately groomed. No acute distress.  VASCULAR: Pedal pulses are  Weakly  palpable at  [facility] and PT bilateral.  Capillary refill time is immediate to all digits,  Normal temperature gradient.   NEUROLOGIC: sensation is normal to 5.07 monofilament at 5/5 sites bilateral.  Light touch is intact bilateral, Muscle strength normal.  MUSCULOSKELETAL: acceptable muscle strength, tone and stability bilateral.  Intrinsic muscluature intact bilateral.  Rectus appearance of foot and digits noted bilateral.  NAILS  Thick disfigured discolored nails both feet.    The right hallux nail is thick and disfigured with no bacterial infection. DERMATOLOGIC: skin color, texture, and turgor are within normal limits.  No preulcerative lesions or ulcers  are seen, no interdigital maceration noted.  No open lesions present.  . No drainage noted.         Assessment & Plan:  Onychomycosis  B/L  IE  Debride nails x 9.  RTC prn   Gardiner Barefoot DPM

## 2017-07-06 DIAGNOSIS — R8290 Unspecified abnormal findings in urine: Secondary | ICD-10-CM | POA: Diagnosis not present

## 2017-07-06 DIAGNOSIS — M810 Age-related osteoporosis without current pathological fracture: Secondary | ICD-10-CM | POA: Diagnosis not present

## 2017-07-06 DIAGNOSIS — K219 Gastro-esophageal reflux disease without esophagitis: Secondary | ICD-10-CM | POA: Diagnosis not present

## 2017-07-06 DIAGNOSIS — J449 Chronic obstructive pulmonary disease, unspecified: Secondary | ICD-10-CM | POA: Diagnosis not present

## 2017-07-06 DIAGNOSIS — I10 Essential (primary) hypertension: Secondary | ICD-10-CM | POA: Diagnosis not present

## 2017-10-25 DIAGNOSIS — F0150 Vascular dementia without behavioral disturbance: Secondary | ICD-10-CM | POA: Diagnosis not present

## 2017-10-25 DIAGNOSIS — G3184 Mild cognitive impairment, so stated: Secondary | ICD-10-CM | POA: Diagnosis not present

## 2017-10-25 DIAGNOSIS — I10 Essential (primary) hypertension: Secondary | ICD-10-CM | POA: Diagnosis not present

## 2017-10-25 DIAGNOSIS — F329 Major depressive disorder, single episode, unspecified: Secondary | ICD-10-CM | POA: Diagnosis not present

## 2018-01-17 DIAGNOSIS — R5383 Other fatigue: Secondary | ICD-10-CM | POA: Diagnosis not present

## 2018-01-17 DIAGNOSIS — R5381 Other malaise: Secondary | ICD-10-CM | POA: Diagnosis not present

## 2018-01-17 DIAGNOSIS — G3184 Mild cognitive impairment, so stated: Secondary | ICD-10-CM | POA: Diagnosis not present

## 2018-01-17 DIAGNOSIS — F329 Major depressive disorder, single episode, unspecified: Secondary | ICD-10-CM | POA: Diagnosis not present

## 2018-01-17 DIAGNOSIS — D649 Anemia, unspecified: Secondary | ICD-10-CM | POA: Diagnosis not present

## 2018-01-17 DIAGNOSIS — D519 Vitamin B12 deficiency anemia, unspecified: Secondary | ICD-10-CM | POA: Diagnosis not present

## 2018-01-17 DIAGNOSIS — I10 Essential (primary) hypertension: Secondary | ICD-10-CM | POA: Diagnosis not present

## 2018-01-17 DIAGNOSIS — F0150 Vascular dementia without behavioral disturbance: Secondary | ICD-10-CM | POA: Diagnosis not present

## 2018-01-17 DIAGNOSIS — R8290 Unspecified abnormal findings in urine: Secondary | ICD-10-CM | POA: Diagnosis not present

## 2018-01-24 DIAGNOSIS — L03119 Cellulitis of unspecified part of limb: Secondary | ICD-10-CM | POA: Diagnosis not present

## 2018-01-24 DIAGNOSIS — J449 Chronic obstructive pulmonary disease, unspecified: Secondary | ICD-10-CM | POA: Diagnosis not present

## 2018-01-24 DIAGNOSIS — B351 Tinea unguium: Secondary | ICD-10-CM | POA: Diagnosis not present

## 2018-01-24 DIAGNOSIS — Z8673 Personal history of transient ischemic attack (TIA), and cerebral infarction without residual deficits: Secondary | ICD-10-CM | POA: Diagnosis not present

## 2018-01-24 DIAGNOSIS — F0150 Vascular dementia without behavioral disturbance: Secondary | ICD-10-CM | POA: Diagnosis not present

## 2018-02-01 ENCOUNTER — Inpatient Hospital Stay
Admission: EM | Admit: 2018-02-01 | Discharge: 2018-02-05 | DRG: 280 | Disposition: A | Discharge status: Skilled Nursing Facility | Payer: PPO | Attending: Internal Medicine | Admitting: Internal Medicine

## 2018-02-01 ENCOUNTER — Inpatient Hospital Stay (HOSPITAL_COMMUNITY)
Admit: 2018-02-01 | Discharge: 2018-02-01 | Disposition: A | Discharge status: Home / Self Care | Payer: PPO | Attending: Cardiovascular Disease | Admitting: Cardiovascular Disease

## 2018-02-01 ENCOUNTER — Emergency Department: Payer: PPO

## 2018-02-01 ENCOUNTER — Encounter: Payer: Self-pay | Admitting: Nurse Practitioner

## 2018-02-01 DIAGNOSIS — J9600 Acute respiratory failure, unspecified whether with hypoxia or hypercapnia: Secondary | ICD-10-CM | POA: Diagnosis present

## 2018-02-01 DIAGNOSIS — R0603 Acute respiratory distress: Secondary | ICD-10-CM | POA: Diagnosis not present

## 2018-02-01 DIAGNOSIS — Z833 Family history of diabetes mellitus: Secondary | ICD-10-CM

## 2018-02-01 DIAGNOSIS — I255 Ischemic cardiomyopathy: Secondary | ICD-10-CM | POA: Diagnosis not present

## 2018-02-01 DIAGNOSIS — Z85828 Personal history of other malignant neoplasm of skin: Secondary | ICD-10-CM

## 2018-02-01 DIAGNOSIS — I110 Hypertensive heart disease with heart failure: Principal | ICD-10-CM | POA: Diagnosis present

## 2018-02-01 DIAGNOSIS — F329 Major depressive disorder, single episode, unspecified: Secondary | ICD-10-CM | POA: Diagnosis not present

## 2018-02-01 DIAGNOSIS — Z9181 History of falling: Secondary | ICD-10-CM | POA: Diagnosis not present

## 2018-02-01 DIAGNOSIS — Z7952 Long term (current) use of systemic steroids: Secondary | ICD-10-CM | POA: Diagnosis not present

## 2018-02-01 DIAGNOSIS — I213 ST elevation (STEMI) myocardial infarction of unspecified site: Secondary | ICD-10-CM

## 2018-02-01 DIAGNOSIS — J9602 Acute respiratory failure with hypercapnia: Secondary | ICD-10-CM | POA: Diagnosis not present

## 2018-02-01 DIAGNOSIS — R739 Hyperglycemia, unspecified: Secondary | ICD-10-CM | POA: Diagnosis not present

## 2018-02-01 DIAGNOSIS — Z515 Encounter for palliative care: Secondary | ICD-10-CM | POA: Diagnosis present

## 2018-02-01 DIAGNOSIS — R54 Age-related physical debility: Secondary | ICD-10-CM

## 2018-02-01 DIAGNOSIS — I2510 Atherosclerotic heart disease of native coronary artery without angina pectoris: Secondary | ICD-10-CM | POA: Diagnosis not present

## 2018-02-01 DIAGNOSIS — J441 Chronic obstructive pulmonary disease with (acute) exacerbation: Secondary | ICD-10-CM | POA: Diagnosis not present

## 2018-02-01 DIAGNOSIS — K219 Gastro-esophageal reflux disease without esophagitis: Secondary | ICD-10-CM | POA: Diagnosis not present

## 2018-02-01 DIAGNOSIS — J432 Centrilobular emphysema: Secondary | ICD-10-CM

## 2018-02-01 DIAGNOSIS — I480 Paroxysmal atrial fibrillation: Secondary | ICD-10-CM | POA: Diagnosis present

## 2018-02-01 DIAGNOSIS — E785 Hyperlipidemia, unspecified: Secondary | ICD-10-CM | POA: Diagnosis not present

## 2018-02-01 DIAGNOSIS — I5021 Acute systolic (congestive) heart failure: Secondary | ICD-10-CM | POA: Diagnosis not present

## 2018-02-01 DIAGNOSIS — I959 Hypotension, unspecified: Secondary | ICD-10-CM | POA: Diagnosis not present

## 2018-02-01 DIAGNOSIS — Z7951 Long term (current) use of inhaled steroids: Secondary | ICD-10-CM

## 2018-02-01 DIAGNOSIS — J449 Chronic obstructive pulmonary disease, unspecified: Secondary | ICD-10-CM | POA: Diagnosis not present

## 2018-02-01 DIAGNOSIS — I2109 ST elevation (STEMI) myocardial infarction involving other coronary artery of anterior wall: Secondary | ICD-10-CM | POA: Diagnosis not present

## 2018-02-01 DIAGNOSIS — G9349 Other encephalopathy: Secondary | ICD-10-CM | POA: Diagnosis present

## 2018-02-01 DIAGNOSIS — I420 Dilated cardiomyopathy: Secondary | ICD-10-CM | POA: Diagnosis not present

## 2018-02-01 DIAGNOSIS — R4182 Altered mental status, unspecified: Secondary | ICD-10-CM | POA: Diagnosis not present

## 2018-02-01 DIAGNOSIS — E875 Hyperkalemia: Secondary | ICD-10-CM | POA: Diagnosis present

## 2018-02-01 DIAGNOSIS — I214 Non-ST elevation (NSTEMI) myocardial infarction: Secondary | ICD-10-CM | POA: Diagnosis not present

## 2018-02-01 DIAGNOSIS — I351 Nonrheumatic aortic (valve) insufficiency: Secondary | ICD-10-CM

## 2018-02-01 DIAGNOSIS — F05 Delirium due to known physiological condition: Secondary | ICD-10-CM | POA: Diagnosis not present

## 2018-02-01 DIAGNOSIS — R2681 Unsteadiness on feet: Secondary | ICD-10-CM | POA: Diagnosis not present

## 2018-02-01 DIAGNOSIS — I69320 Aphasia following cerebral infarction: Secondary | ICD-10-CM

## 2018-02-01 DIAGNOSIS — J9601 Acute respiratory failure with hypoxia: Secondary | ICD-10-CM | POA: Diagnosis not present

## 2018-02-01 DIAGNOSIS — Z79899 Other long term (current) drug therapy: Secondary | ICD-10-CM | POA: Diagnosis not present

## 2018-02-01 DIAGNOSIS — R0602 Shortness of breath: Secondary | ICD-10-CM | POA: Diagnosis not present

## 2018-02-01 DIAGNOSIS — L03116 Cellulitis of left lower limb: Secondary | ICD-10-CM | POA: Diagnosis not present

## 2018-02-01 DIAGNOSIS — Z741 Need for assistance with personal care: Secondary | ICD-10-CM | POA: Diagnosis not present

## 2018-02-01 DIAGNOSIS — Z8249 Family history of ischemic heart disease and other diseases of the circulatory system: Secondary | ICD-10-CM

## 2018-02-01 DIAGNOSIS — I69351 Hemiplegia and hemiparesis following cerebral infarction affecting right dominant side: Secondary | ICD-10-CM

## 2018-02-01 DIAGNOSIS — Z7189 Other specified counseling: Secondary | ICD-10-CM | POA: Diagnosis not present

## 2018-02-01 DIAGNOSIS — M6281 Muscle weakness (generalized): Secondary | ICD-10-CM | POA: Diagnosis not present

## 2018-02-01 DIAGNOSIS — F419 Anxiety disorder, unspecified: Secondary | ICD-10-CM | POA: Diagnosis present

## 2018-02-01 DIAGNOSIS — Z7401 Bed confinement status: Secondary | ICD-10-CM | POA: Diagnosis not present

## 2018-02-01 DIAGNOSIS — Z66 Do not resuscitate: Secondary | ICD-10-CM | POA: Diagnosis present

## 2018-02-01 DIAGNOSIS — R278 Other lack of coordination: Secondary | ICD-10-CM | POA: Diagnosis not present

## 2018-02-01 DIAGNOSIS — R410 Disorientation, unspecified: Secondary | ICD-10-CM | POA: Diagnosis not present

## 2018-02-01 DIAGNOSIS — I5022 Chronic systolic (congestive) heart failure: Secondary | ICD-10-CM | POA: Diagnosis not present

## 2018-02-01 HISTORY — DX: Vitamin D deficiency, unspecified: E55.9

## 2018-02-01 HISTORY — DX: Hyperlipidemia, unspecified: E78.5

## 2018-02-01 HISTORY — DX: Gastro-esophageal reflux disease without esophagitis: K21.9

## 2018-02-01 HISTORY — DX: Essential (primary) hypertension: I10

## 2018-02-01 HISTORY — DX: Insomnia, unspecified: G47.00

## 2018-02-01 HISTORY — DX: Tinea unguium: B35.1

## 2018-02-01 HISTORY — DX: Paroxysmal atrial fibrillation: I48.0

## 2018-02-01 HISTORY — DX: Unspecified malignant neoplasm of skin, unspecified: C44.90

## 2018-02-01 HISTORY — DX: Anxiety disorder, unspecified: F41.9

## 2018-02-01 HISTORY — DX: Other specified postprocedural states: Z98.890

## 2018-02-01 HISTORY — DX: Personal history of transient ischemic attack (TIA), and cerebral infarction without residual deficits: Z86.73

## 2018-02-01 HISTORY — DX: Personal history of other medical treatment: Z92.89

## 2018-02-01 HISTORY — DX: Cellulitis of unspecified part of limb: L03.119

## 2018-02-01 HISTORY — DX: Depression, unspecified: F32.A

## 2018-02-01 HISTORY — DX: Major depressive disorder, single episode, unspecified: F32.9

## 2018-02-01 HISTORY — DX: Syncope and collapse: R55

## 2018-02-01 HISTORY — DX: Unspecified asthma, uncomplicated: J45.909

## 2018-02-01 LAB — BLOOD GAS, VENOUS
Acid-base deficit: 4.7 mmol/L — ABNORMAL HIGH (ref 0.0–2.0)
Bicarbonate: 25.4 mmol/L (ref 20.0–28.0)
FIO2: 0.4
PCO2 VEN: 68 mmHg — AB (ref 44.0–60.0)
Patient temperature: 37
pH, Ven: 7.18 — CL (ref 7.250–7.430)

## 2018-02-01 LAB — BASIC METABOLIC PANEL
ANION GAP: 10 (ref 5–15)
ANION GAP: 7 (ref 5–15)
BUN: 25 mg/dL — ABNORMAL HIGH (ref 8–23)
BUN: 25 mg/dL — ABNORMAL HIGH (ref 8–23)
CALCIUM: 8.9 mg/dL (ref 8.9–10.3)
CHLORIDE: 104 mmol/L (ref 98–111)
CO2: 26 mmol/L (ref 22–32)
CO2: 29 mmol/L (ref 22–32)
CREATININE: 0.82 mg/dL (ref 0.44–1.00)
Calcium: 8.9 mg/dL (ref 8.9–10.3)
Chloride: 101 mmol/L (ref 98–111)
Creatinine, Ser: 1.01 mg/dL — ABNORMAL HIGH (ref 0.44–1.00)
GFR calc non Af Amer: >60 mL/min (ref >60)
GFR, EST AFRICAN AMERICAN: 55 mL/min — AB (ref >60)
GFR, EST NON AFRICAN AMERICAN: 47 mL/min — AB (ref >60)
Glucose, Bld: 208 mg/dL — ABNORMAL HIGH (ref 70–99)
Glucose, Bld: 201 mg/dL — ABNORMAL HIGH (ref 70–99)
POTASSIUM: 4.8 mmol/L (ref 3.5–5.1)
Potassium: 5.2 mmol/L — ABNORMAL HIGH (ref 3.5–5.1)
SODIUM: 140 mmol/L (ref 135–145)
SODIUM: 137 mmol/L (ref 135–145)

## 2018-02-01 LAB — CBC WITH DIFFERENTIAL/PLATELET
BASOS ABS: 0.1 10*3/uL (ref 0–0.1)
Basophils Relative: 1 %
EOS PCT: 4 %
Eosinophils Absolute: 0.3 10*3/uL (ref 0–0.7)
HCT: 45.3 % (ref 35.0–47.0)
HEMOGLOBIN: 14.7 g/dL (ref 12.0–16.0)
LYMPHS ABS: 3.2 10*3/uL (ref 1.0–3.6)
LYMPHS PCT: 32 %
MCH: 29.6 pg (ref 26.0–34.0)
MCHC: 32.5 g/dL (ref 32.0–36.0)
MCV: 91.1 fL (ref 80.0–100.0)
Monocytes Absolute: 0.4 10*3/uL (ref 0.2–0.9)
Monocytes Relative: 4 %
Neutro Abs: 5.9 10*3/uL (ref 1.4–6.5)
Neutrophils Relative %: 59 %
PLATELETS: 285 10*3/uL (ref 150–440)
RBC: 4.97 MIL/uL (ref 3.80–5.20)
RDW: 16 % — ABNORMAL HIGH (ref 11.5–14.5)
WBC: 10 10*3/uL (ref 3.6–11.0)

## 2018-02-01 LAB — HEPARIN LEVEL (UNFRACTIONATED): Heparin Unfractionated: 0.44 IU/mL (ref 0.30–0.70)

## 2018-02-01 LAB — LACTIC ACID, PLASMA
LACTIC ACID, VENOUS: 2.6 mmol/L — AB (ref 0.5–1.9)
LACTIC ACID, VENOUS: 2 mmol/L — AB (ref 0.5–1.9)
Lactic Acid, Venous: 3.2 mmol/L (ref 0.5–1.9)

## 2018-02-01 LAB — BRAIN NATRIURETIC PEPTIDE: B NATRIURETIC PEPTIDE 5: 1990 pg/mL — AB (ref 0.0–100.0)

## 2018-02-01 LAB — PROTIME-INR
INR: 0.9
PROTHROMBIN TIME: 12.1 s (ref 11.4–15.2)

## 2018-02-01 LAB — APTT: aPTT: 24 seconds (ref 24–36)

## 2018-02-01 LAB — ECHOCARDIOGRAM COMPLETE
Height: 65 in
Weight: 1506.18 oz

## 2018-02-01 LAB — PROCALCITONIN: PROCALCITONIN: 0.95 ng/mL

## 2018-02-01 LAB — TROPONIN I
TROPONIN I: 0.32 ng/mL — AB (ref <0.03)
TROPONIN I: 2.64 ng/mL — AB (ref <0.03)

## 2018-02-01 LAB — GLUCOSE, CAPILLARY: GLUCOSE-CAPILLARY: 134 mg/dL — AB (ref 70–99)

## 2018-02-01 MED ORDER — PANTOPRAZOLE SODIUM 40 MG PO TBEC
40.0000 mg | DELAYED_RELEASE_TABLET | Freq: Every day | ORAL | Status: DC
  Administered 2018-02-01 – 2018-02-05 (×5): 40 mg via ORAL
  Filled 2018-02-01 (×5): qty 1

## 2018-02-01 MED ORDER — HEPARIN BOLUS VIA INFUSION
2600.0000 [IU] | Freq: Once | INTRAVENOUS | Status: AC
  Administered 2018-02-01: 2600 [IU] via INTRAVENOUS
  Filled 2018-02-01: qty 2600

## 2018-02-01 MED ORDER — HEPARIN (PORCINE) IN NACL 100-0.45 UNIT/ML-% IJ SOLN
600.0000 [IU]/h | INTRAMUSCULAR | Status: DC
  Administered 2018-02-01 – 2018-02-03 (×3): 500 [IU]/h via INTRAVENOUS
  Administered 2018-02-04: 600 [IU]/h via INTRAVENOUS
  Filled 2018-02-01 (×2): qty 250

## 2018-02-01 MED ORDER — ONDANSETRON HCL 4 MG/2ML IJ SOLN: 4.0000 mg | Freq: Four times a day (QID) | INTRAMUSCULAR | Status: DC | PRN

## 2018-02-01 MED ORDER — CEPHALEXIN 500 MG PO CAPS
500.0000 mg | ORAL_CAPSULE | Freq: Three times a day (TID) | ORAL | Status: DC
  Administered 2018-02-01 (×2): 500 mg via ORAL
  Filled 2018-02-01 (×3): qty 1

## 2018-02-01 MED ORDER — LEVOFLOXACIN IN D5W 750 MG/150ML IV SOLN
750.0000 mg | Freq: Once | INTRAVENOUS | Status: AC
  Administered 2018-02-01: 750 mg via INTRAVENOUS
  Filled 2018-02-01: qty 150

## 2018-02-01 MED ORDER — IPRATROPIUM-ALBUTEROL 0.5-2.5 (3) MG/3ML IN SOLN
3.0000 mL | RESPIRATORY_TRACT | Status: DC
  Administered 2018-02-01 (×3): 3 mL via RESPIRATORY_TRACT
  Filled 2018-02-01 (×3): qty 3

## 2018-02-01 MED ORDER — ACETAMINOPHEN 325 MG PO TABS
975.0000 mg | ORAL_TABLET | Freq: Three times a day (TID) | ORAL | Status: DC
  Administered 2018-02-01: 975 mg via ORAL
  Filled 2018-02-01: qty 3

## 2018-02-01 MED ORDER — ADULT MULTIVITAMIN W/MINERALS CH
1.0000 | ORAL_TABLET | Freq: Every day | ORAL | Status: DC
  Administered 2018-02-01 – 2018-02-05 (×5): 1 via ORAL
  Filled 2018-02-01 (×5): qty 1

## 2018-02-01 MED ORDER — FUROSEMIDE 10 MG/ML IJ SOLN
20.0000 mg | Freq: Once | INTRAMUSCULAR | Status: AC
  Administered 2018-02-01: 20 mg via INTRAVENOUS
  Filled 2018-02-01: qty 4

## 2018-02-01 MED ORDER — FAMOTIDINE 20 MG PO TABS
20.0000 mg | ORAL_TABLET | Freq: Every day | ORAL | Status: DC
  Administered 2018-02-02 – 2018-02-05 (×4): 20 mg via ORAL
  Filled 2018-02-01 (×4): qty 1

## 2018-02-01 MED ORDER — PREDNISONE 2.5 MG PO TABS
2.5000 mg | ORAL_TABLET | Freq: Every day | ORAL | Status: DC
  Administered 2018-02-02 – 2018-02-05 (×4): 2.5 mg via ORAL
  Filled 2018-02-01 (×4): qty 1

## 2018-02-01 MED ORDER — ACETAMINOPHEN 325 MG PO TABS: 650.0000 mg | ORAL_TABLET | Freq: Four times a day (QID) | ORAL | Status: DC | PRN

## 2018-02-01 MED ORDER — ACETAMINOPHEN 650 MG RE SUPP: 650.0000 mg | Freq: Four times a day (QID) | RECTAL | Status: DC | PRN

## 2018-02-01 MED ORDER — FUROSEMIDE 10 MG/ML IJ SOLN
20.0000 mg | Freq: Once | INTRAMUSCULAR | Status: AC
  Administered 2018-02-01: 20 mg via INTRAVENOUS
  Filled 2018-02-01: qty 2

## 2018-02-01 MED ORDER — PAROXETINE HCL 10 MG PO TABS
10.0000 mg | ORAL_TABLET | Freq: Every day | ORAL | Status: DC
  Administered 2018-02-01 – 2018-02-05 (×5): 10 mg via ORAL
  Filled 2018-02-01 (×5): qty 1

## 2018-02-01 MED ORDER — PIPERACILLIN-TAZOBACTAM 3.375 G IVPB
3.3750 g | Freq: Three times a day (TID) | INTRAVENOUS | Status: DC
  Administered 2018-02-01 – 2018-02-03 (×5): 3.375 g via INTRAVENOUS
  Filled 2018-02-01 (×4): qty 50

## 2018-02-01 MED ORDER — ONDANSETRON HCL 4 MG PO TABS: 4.0000 mg | ORAL_TABLET | Freq: Four times a day (QID) | ORAL | Status: DC | PRN

## 2018-02-01 MED ORDER — METOPROLOL SUCCINATE ER 25 MG PO TB24
25.0000 mg | ORAL_TABLET | Freq: Every day | ORAL | Status: DC
  Administered 2018-02-01 – 2018-02-03 (×3): 25 mg via ORAL
  Filled 2018-02-01 (×4): qty 1

## 2018-02-01 MED ORDER — ATORVASTATIN CALCIUM 20 MG PO TABS
40.0000 mg | ORAL_TABLET | Freq: Every day | ORAL | Status: DC
  Administered 2018-02-01 – 2018-02-04 (×4): 40 mg via ORAL
  Filled 2018-02-01 (×4): qty 2

## 2018-02-01 MED ORDER — ALPRAZOLAM 0.5 MG PO TABS
0.2500 mg | ORAL_TABLET | Freq: Every evening | ORAL | Status: DC | PRN
  Administered 2018-02-03: 0.25 mg via ORAL
  Filled 2018-02-01: qty 1

## 2018-02-01 MED ORDER — NITROGLYCERIN 0.4 MG SL SUBL: 0.4000 mg | SUBLINGUAL_TABLET | SUBLINGUAL | Status: DC | PRN

## 2018-02-01 MED ORDER — ASPIRIN 325 MG PO TABS
325.0000 mg | ORAL_TABLET | Freq: Every day | ORAL | Status: DC
  Administered 2018-02-02 – 2018-02-03 (×2): 325 mg via ORAL
  Filled 2018-02-01 (×3): qty 1

## 2018-02-01 MED ORDER — ATORVASTATIN CALCIUM 20 MG PO TABS: 40.0000 mg | ORAL_TABLET | Freq: Every day | ORAL | Status: DC

## 2018-02-01 MED ORDER — SODIUM CHLORIDE 0.45 % IV SOLN
INTRAVENOUS | Status: DC
  Administered 2018-02-01: 21:00:00 via INTRAVENOUS

## 2018-02-01 MED ORDER — ASPIRIN 81 MG PO CHEW
324.0000 mg | CHEWABLE_TABLET | Freq: Once | ORAL | Status: AC
  Administered 2018-02-01: 324 mg via ORAL

## 2018-02-01 MED ORDER — MOMETASONE FURO-FORMOTEROL FUM 200-5 MCG/ACT IN AERO
2.0000 | INHALATION_SPRAY | Freq: Two times a day (BID) | RESPIRATORY_TRACT | Status: DC
  Administered 2018-02-01 – 2018-02-05 (×8): 2 via RESPIRATORY_TRACT
  Filled 2018-02-01: qty 8.8

## 2018-02-01 MED ORDER — LORATADINE 10 MG PO TABS
10.0000 mg | ORAL_TABLET | Freq: Every day | ORAL | Status: DC
  Administered 2018-02-01 – 2018-02-05 (×5): 10 mg via ORAL
  Filled 2018-02-01 (×5): qty 1

## 2018-02-01 NOTE — H&P (Signed)
[facility] NAME: Dana Austin    MR#:  [id number]  DATE OF BIRTH:  Apr 26, 1925  DATE OF ADMISSION:  02/01/2018  PRIMARY CARE PHYSICIAN: Tracie Harrier, MD   REQUESTING/REFERRING PHYSICIAN: Earleen Newport, MD  CHIEF COMPLAINT:   Chief Complaint  Patient presents with  . Respiratory Distress    HISTORY OF PRESENT ILLNESS: Dana Austin  is a 84 y.o. female with a known history of previous stroke, COPD, depression, essential hypertension, GERD who has chronic shortness of breath with exertion who is presenting with worsening shortness of breath.  Apparently patient has had some difficulty with shortness of breath for the past 2 weeks but yesterday evening got worse.  And then overnight it progressively got worse.  Patient brought to the emergency room and had to be placed on BiPAP she continues to remain on BiPAP.  Patient also noticed to have acute ST MI.  She was seen in consultation by cardiology and based on patient's age recommendations were made to treat her medically especially with her acute respiratory failure.  Patient also complains of swelling of the lower extremity.  She does not have any chest pain.  Patient has been recently treated for right lower extremity cellulitis and is on oral antibiotics.  PAST MEDICAL HISTORY:   Past Medical History:  Diagnosis Date  . Anxiety   . Asthma   . COPD (chronic obstructive pulmonary disease) ([place])    a. Chronic prednisone.  . Depression   . Essential hypertension   . GERD (gastroesophageal reflux disease)   . History of cardiac cath    a. 10/2009 Cath: reportedly nl.  . History of echocardiogram    a. 11/2014 Echo: Nl LVEF/RVEF. Triv AI/TR ([facility]).  . History of ischemic left MCA stroke    a. 08/2014 CVA in [place], [place] - treated w/ TPA. Residual R hemiparesis and aphasia.  . Hyperlipidemia   . Insomnia   . Lower extremity cellulitis    a. Dx 01/04/2018 - outpt Keflex.  .  Onychomycosis   . PAF (paroxysmal atrial fibrillation) ([place])   . Skin cancer   . Subdural hematoma ([place])    a. 11/2014 in setting of syncope/fall.  . Syncope    a. 11/2014 syncope w/ fall and resultant T4 compression fx and SDH. Felt to be 2/2 overmedication and dehydration.  . Vitamin D deficiency     PAST SURGICAL HISTORY:  Past Surgical History:  Procedure Laterality Date  . none      SOCIAL HISTORY:  Social History   Tobacco Use  . Smoking status: Never Smoker  . Smokeless tobacco: Never Used  Substance Use Topics  . Alcohol use: No    Comment: occasional    FAMILY HISTORY:  Family History  Problem Relation Age of Onset  . CAD Brother   . Diabetes Mellitus II Mother     DRUG ALLERGIES:  Allergies  Allergen Reactions  . Meloxicam Shortness Of Breath    Pt is not aware she is allergic to this medication. She "becomes short of breath all the time"     REVIEW OF SYSTEMS:   CONSTITUTIONAL: No fever, fatigue or weakness.  EYES: No blurred or double vision.  EARS, NOSE, AND THROAT: No tinnitus or ear pain.  RESPIRATORY: No cough, positive shortness of breath, wheezing or hemoptysis.  CARDIOVASCULAR: No chest pain, orthopnea, edema.  GASTROINTESTINAL: No nausea, vomiting, diarrhea or abdominal pain.  GENITOURINARY: No dysuria, hematuria.  ENDOCRINE:  No polyuria, nocturia,  HEMATOLOGY: No anemia, easy bruising or bleeding SKIN: No rash or lesion. MUSCULOSKELETAL: No joint pain or arthritis.   NEUROLOGIC: No tingling, numbness, weakness.  PSYCHIATRY: No anxiety or depression.   MEDICATIONS AT HOME:  Prior to Admission medications   Medication Sig Start Date End Date Taking? Authorizing Provider  cephALEXin (KEFLEX) 500 MG capsule Take 500 mg by mouth 3 (three) times daily. 01/24/18 02/03/18 Yes [provider]  ALPRAZolam Duanne Moron) 0.25 MG tablet Take 0.25 mg by mouth at bedtime as needed for anxiety.    [provider]  atorvastatin (LIPITOR) 80 MG  tablet Take 80 mg by mouth daily. 03/01/15   [provider]  loratadine (CLARITIN) 10 MG tablet Take 10 mg by mouth daily.    [provider]  metoprolol succinate (TOPROL-XL) 25 MG 24 hr tablet Take 25 mg by mouth daily.    [provider]  omeprazole (PRILOSEC) 20 MG capsule Take 20 mg by mouth daily.    [provider]  levETIRAcetam (KEPPRA) 500 MG tablet Take 500 mg by mouth 2 (two) times daily.  01/02/15  [provider]      PHYSICAL EXAMINATION:   VITAL SIGNS: Blood pressure 110/74, pulse 83, temperature 98.2 F (36.8 C), temperature source Axillary, resp. rate (!) 22, height 5\' 7"  (1.702 m), weight 43.1 kg, SpO2 100 %.  GENERAL:  84 y.o.-year-old patient lying in the bed with acute distress  eYES: Pupils equal, round, reactive to light and accommodation. No scleral icterus. Extraocular muscles intact.  HEENT: Head atraumatic, normocephalic. Oropharynx and nasopharynx clear.  NECK:  Supple, no jugular venous distention. No thyroid enlargement, no tenderness.  LUNGS: Crackles throughout both lungs patient currently on BiPAP CARDIOVASCULAR: S1, S2 normal. No murmurs, rubs, or gallops.  ABDOMEN: Soft, nontender, nondistended. Bowel sounds present. No organomegaly or mass.  EXTREMITIES: No pedal edema, cyanosis, or clubbing.  NEUROLOGIC: Cranial nerves II through XII are intact. Muscle strength 5/5 in all extremities. Sensation intact. Gait not checked.  PSYCHIATRIC: The patient is alert and oriented x 3.  SKIN: No obvious rash, lesion, or ulcer.   LABORATORY PANEL:   CBC Recent Labs  Lab 02/01/18 0722  WBC 10.0  HGB 14.7  HCT 45.3  PLT 285  MCV 91.1  MCH 29.6  MCHC 32.5  RDW 16.0*  LYMPHSABS 3.2  MONOABS 0.4  EOSABS 0.3  BASOSABS 0.1   ------------------------------------------------------------------------------------------------------------------  Chemistries  Recent Labs  Lab 02/01/18 0722  NA 140  K 5.2*  CL 104   CO2 26  GLUCOSE 208*  BUN 25*  CREATININE 0.82  CALCIUM 8.9   ------------------------------------------------------------------------------------------------------------------ estimated creatinine clearance is 30.4 mL/min (by C-G formula based on SCr of 0.82 mg/dL). ------------------------------------------------------------------------------------------------------------------ No results for input(s): TSH, T4TOTAL, T3FREE, THYROIDAB in the last 72 hours.  Invalid input(s): FREET3   Coagulation profile No results for input(s): INR, PROTIME in the last 168 hours. ------------------------------------------------------------------------------------------------------------------- No results for input(s): DDIMER in the last 72 hours. -------------------------------------------------------------------------------------------------------------------  Cardiac Enzymes Recent Labs  Lab 02/01/18 0722  TROPONINI 0.32*   ------------------------------------------------------------------------------------------------------------------ Invalid input(s): POCBNP  ---------------------------------------------------------------------------------------------------------------  Urinalysis    Component Value Date/Time   COLORURINE AMBER (A) 06/16/2015 1408   APPEARANCEUR CLOUDY (A) 06/16/2015 1408   LABSPEC 1.020 06/16/2015 1408   PHURINE 5.5 06/16/2015 1408   GLUCOSEU 500 (A) 06/16/2015 1408   HGBUR 2+ (A) 06/16/2015 1408   BILIRUBINUR NEGATIVE 06/16/2015 1408   KETONESUR NEGATIVE 06/16/2015 1408   PROTEINUR 30 (A) 06/16/2015 1408  NITRITE NEGATIVE 06/16/2015 1408   LEUKOCYTESUR 1+ (A) 06/16/2015 1408     RADIOLOGY: Dg Chest Port 1 View  Result Date: 02/01/2018 CLINICAL DATA:  Shortness of breath EXAM: PORTABLE CHEST 1 VIEW COMPARISON:  04/04/2015 FINDINGS: Cardiac shadow remains enlarged. Aortic calcifications are again seen. Small bilateral pleural effusions are noted with likely  underlying basilar atelectasis. Some interstitial changes are noted on the right which may represent some mild interstitial edema. No bony abnormality is noted. IMPRESSION: Bilateral effusions increased from the prior exam with bibasilar atelectatic changes. Mild asymmetric density is noted on the right which may represent interstitial edema. Electronically Signed   By: Inez Catalina M.D.   On: 02/01/2018 07:48    EKG: Orders placed or performed during the hospital encounter of 02/01/18  . ED EKG  . ED EKG  . EKG 12-Lead  . EKG 12-Lead  . EKG 12-Lead  . EKG 12-Lead  . EKG 12-Lead  . EKG 12-Lead    IMPRESSION AND PLAN: Patient is 84 year old with COPD not on any oxygen presenting with shortness of breath  1.  Acute hypoxic respiratory failure due to acute CHF type unknown Treat with IV Lasix Echocardiogram of the heart Cardiology has already seen the patient  2.  Acute ST MI Based on patient's acute respiratory failure and her age family and cardiology has decided to proceed with medical Treat with aspirin Heparin drip Start Lipitor 40 Continue metoprolol  3.  COPD we will place patient on nebulizer therapy no evidence of acute exasperation  4.  Recent lower extremity cellulitis continue Keflex  5.  Essential hypertension continue metoprolol  6, miscellaneous continue heparin which would also be good for DVT prophylaxis    All the records are reviewed and case discussed with ED provider. Management plans discussed with the patient, family and they are in agreement.  CODE STATUS: Code Status History    Date Active Date Inactive Code Status Order ID Comments User Context   04/05/2015 0442 04/06/2015 1907 Full Code [id number]  Harrie Foreman, MD Inpatient       TOTAL TIME TAKING CARE OF THIS PATIENT: 55 minutes.    Dustin Flock M.D on 02/01/2018 at 9:54 AM  Between 7am to 6pm - Pager - [phone]  After 6pm go to [email or web address] - password [email or web address]  [facility]  [phone]  CC: Primary care physician; Tracie Harrier, MD

## 2018-02-01 NOTE — Progress Notes (Signed)
Chaplain received PG for code stemi. Chaplain went to patient room and family was by patient bedside. Son Catalina [place]) and daughter in Sports coach. Chaplain introduce herself as on-call Chaplain and made her pastoral presence known by giving concern for patient. Matt patient son express how patient breathing had decrease and she had never needed oxygen before. 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 Chaplain went to patient bedside and told her who she was. The patient had a CPAP on so Chaplain did not want the patient to talk. Chaplain ask family was there anything she could do. Matt said no thank, not at this time. Chaplain said if there is a need please have Chaplain page.

## 2018-02-01 NOTE — Progress Notes (Signed)
ANTICOAGULATION CONSULT NOTE - Initial Consult  Pharmacy Consult for heparin Indication: chest pain/ACS  Allergies  Allergen Reactions  . Meloxicam Shortness Of Breath    Pt is not aware she is allergic to this medication. She "becomes short of breath all the time"     Patient Measurements: Height: 5\' 7"  (170.2 cm) Weight: 95 lb (43.1 kg) IBW/kg (Calculated) : 61.6 Heparin Dosing Weight: 43.1 kg  Vital Signs: Temp: 98.2 F (36.8 C) (08/30 0718) Temp Source: Axillary (08/30 0718) BP: 110/74 (08/30 0800) Pulse Rate: 83 (08/30 0800)  Labs: Recent Labs    02/01/18 0722  HGB 14.7  HCT 45.3  PLT 285  CREATININE 0.82  TROPONINI 0.32*    Estimated Creatinine Clearance: 30.4 mL/min (by C-G formula based on SCr of 0.82 mg/dL).   Medical History: Past Medical History:  Diagnosis Date  . Stroke ([place])   . Subdural hematoma ([facility])     Medications:  Infusions:  . heparin    . levofloxacin (LEVAQUIN) IV 750 mg (02/01/18 8592)    Assessment: 84 yo female with history of CVA and subdural hematoma presents to ED for respiratory distress.  Will medically manage at present with heparin and aspirin.  Goal of Therapy:  Heparin level 0.3-0.7 units/ml Monitor platelets by anticoagulation protocol: Yes   Plan:  Give 2600 units bolus x 1 Start heparin infusion at 500 units/hr Check anti-Xa level in 8 hours and daily while on heparin Continue to monitor H&H and platelets  Prudy Feeler, RPh 02/01/2018,9:25 AM

## 2018-02-01 NOTE — ED Notes (Signed)
AAOx3.  Skin warm and dry.  ON Bipap.  Respirations regular and non labored.  Family with patient.  NAD

## 2018-02-01 NOTE — Progress Notes (Signed)
ANTICOAGULATION CONSULT NOTE - Initial Consult  Pharmacy Consult for heparin Indication: chest pain/ACS  Allergies  Allergen Reactions  . Meloxicam Shortness Of Breath    Pt is not aware she is allergic to this medication. She "becomes short of breath all the time"     Patient Measurements: Height: 5\' 5"  (165.1 cm) Weight: 94 lb 2.2 oz (42.7 kg) IBW/kg (Calculated) : 57 Heparin Dosing Weight: 43.1 kg  Vital Signs: Temp: 97.7 F (36.5 C) (08/30 2000) Temp Source: Oral (08/30 2000) BP: 102/59 (08/30 2000) Pulse Rate: 92 (08/30 2000)  Labs: Recent Labs    02/01/18 0722 02/01/18 0942 02/01/18 2024  HGB 14.7  --   --   HCT 45.3  --   --   PLT 285  --   --   APTT  --  24  --   LABPROT  --  12.1  --   INR  --  0.90  --   HEPARINUNFRC  --   --  0.44  CREATININE 0.82  --   --   TROPONINI 0.32*  --  2.64*    Estimated Creatinine Clearance: 30.1 mL/min (by C-G formula based on SCr of 0.82 mg/dL).   Medical History: Past Medical History:  Diagnosis Date  . Anxiety   . Asthma   . COPD (chronic obstructive pulmonary disease) ([place])    a. Chronic prednisone.  . Depression   . Essential hypertension   . GERD (gastroesophageal reflux disease)   . History of cardiac cath    a. 10/2009 Cath: reportedly nl.  . History of echocardiogram    a. 11/2014 Echo: Nl LVEF/RVEF. Triv AI/TR ([facility]).  . History of ischemic left MCA stroke    a. 08/2014 CVA in [place], [place] - treated w/ TPA. Residual R hemiparesis and aphasia.  . Hyperlipidemia   . Insomnia   . Lower extremity cellulitis    a. Dx 01/24/2018 - outpt Keflex.  . Onychomycosis   . PAF (paroxysmal atrial fibrillation) ([place])   . Skin cancer   . Subdural hematoma ([place])    a. 11/2014 in setting of syncope/fall.  . Syncope    a. 11/2014 syncope w/ fall and resultant T4 compression fx and SDH. Felt to be 2/2 overmedication and dehydration.  . Vitamin D deficiency     Medications:  Infusions:  . sodium chloride 50 mL/hr  at 02/01/18 2033  . heparin 500 Units/hr (02/01/18 0952)  . piperacillin-tazobactam (ZOSYN)  IV 3.375 g (02/01/18 1846)    Assessment: 84 yo female with history of CVA and subdural hematoma presents to ED for respiratory distress.  Will medically manage at present with heparin and aspirin.  Heparin infusing at 500 units/hr, first Heparin level at 20:24 resulted at 0.44  Goal of Therapy:  Heparin level 0.3-0.7 units/ml Monitor platelets by anticoagulation protocol: Yes   Plan:  Heparin level is therapeutic. Will continue with current rate and check a Heparin level in 8 hours to confirm.  Paulina Fusi, PharmD, BCPS 02/01/2018 9:55 PM

## 2018-02-01 NOTE — Progress Notes (Signed)
Pt was transported to CCU from the ED while on the bipap. 

## 2018-02-01 NOTE — Progress Notes (Signed)
Pharmacy Antibiotic Note  Dana Austin is a 84 y.o. female admitted on 02/01/2018 with CAP/sepsis.  Pharmacy has been consulted for Zosyn dosing.  Plan: Zosyn 3.375 gm IV Q8H EI  Height: 5\' 5"  (165.1 cm) Weight: 94 lb 2.2 oz (42.7 kg) IBW/kg (Calculated) : 57  Temp (24hrs), Avg:98.5 F (36.9 C), Min:98.2 F (36.8 C), Max:98.8 F (37.1 C)  Recent Labs  Lab 02/01/18 0722 02/01/18 0725  WBC 10.0  --   CREATININE 0.82  --   LATICACIDVEN  --  3.2*    Estimated Creatinine Clearance: 30.1 mL/min (by C-G formula based on SCr of 0.82 mg/dL).    Allergies  Allergen Reactions  . Meloxicam Shortness Of Breath    Pt is not aware she is allergic to this medication. She "becomes short of breath all the time"     Antimicrobials this admission:   Dose adjustments this admission:   Microbiology results:  BCx:   UCx:    Sputum:    MRSA PCR:   Thank you for allowing pharmacy to be a part of this patient's care.  Laural Benes, Pharm.D., BCPS Clinical Pharmacist 02/01/2018 6:38 PM

## 2018-02-01 NOTE — ED Provider Notes (Signed)
Patient was evaluated by Dr. Fletcher Anon while in the ER, after discussions with the family they have decided to not take her to the Cath Lab emergently.  To medically manage her at this time.  She still does not have any chest pain and is being placed on heparin and has been given aspirin.   Earleen Newport, MD 02/01/18 [phone]

## 2018-02-01 NOTE — Progress Notes (Signed)
Decrease to 2l after neb

## 2018-02-01 NOTE — Consult Note (Signed)
Name: Dana Austin MRN: [id number] DOB: 12-21-1924     CONSULTATION DATE: 02/01/2018    HISTORY OF PRESENT ILLNESS:  84 years old lady with history of COPD steroid-dependent, CVA, paroxysmal atrial fibrillation.  Patient presented to the ED with progressively worsening shortness of breath for the past several days, initial O2 sats 60% on room air, was tried on a nonrebreather and had to be placed on a BiPAP because of worsening respiratory status.  Initial EKG was remarkable for anterolateral ST elevation MI, code STEMI was activated, after cardiology evaluation the patient was found not to be a good candidate for cardiac cath considering her age and frail state. Patient arrived to ICU awake, on BiPAP 10/8 in no distress. All history was obtained from admitting physician and EMR.  PAST MEDICAL HISTORY :   has a past medical history of Anxiety, Asthma, COPD (chronic obstructive pulmonary disease) ([place]), Depression, Essential hypertension, GERD (gastroesophageal reflux disease), History of cardiac cath, History of echocardiogram, History of ischemic left MCA stroke, Hyperlipidemia, Insomnia, Lower extremity cellulitis, Onychomycosis, PAF (paroxysmal atrial fibrillation) ([place]), Skin cancer, Subdural hematoma ([place]), Syncope, and Vitamin D deficiency.  has a past surgical history that includes none. Prior to Admission medications   Medication Sig Start Date End Date Taking? Authorizing Provider  acetaminophen (TYLENOL) 325 MG tablet Take 975 mg by mouth 3 (three) times daily.   Yes [provider]  ALPRAZolam (XANAX) 0.25 MG tablet Take 0.25 mg by mouth at bedtime as needed for anxiety.   Yes [provider]  atorvastatin (LIPITOR) 40 MG tablet Take 40 mg by mouth daily.  03/01/15  Yes [provider]  budesonide-formoterol (SYMBICORT) 160-4.5 MCG/ACT inhaler Inhale 2 puffs into the lungs 2 (two) times daily.   Yes [provider]  cephALEXin (KEFLEX) 500 MG  capsule Take 500 mg by mouth 3 (three) times daily. 01/24/18 02/03/18 Yes [provider]  loratadine (CLARITIN) 10 MG tablet Take 10 mg by mouth daily.   Yes [provider]  metoprolol succinate (TOPROL-XL) 25 MG 24 hr tablet Take 25 mg by mouth daily.   Yes [provider]  Multiple Vitamin (MULTIVITAMIN WITH MINERALS) TABS tablet Take 1 tablet by mouth daily.   Yes [provider]  omeprazole (PRILOSEC) 20 MG capsule Take 20 mg by mouth daily.   Yes [provider]  PARoxetine (PAXIL) 10 MG tablet Take 10 mg by mouth daily.  10/25/17  Yes [provider]  predniSONE (DELTASONE) 2.5 MG tablet Take 2.5 mg by mouth daily with breakfast.   Yes [provider]  levETIRAcetam (KEPPRA) 500 MG tablet Take 500 mg by mouth 2 (two) times daily.  01/02/15  [provider]   Allergies  Allergen Reactions  . Meloxicam Shortness Of Breath    Pt is not aware she is allergic to this medication. She "becomes short of breath all the time"     FAMILY HISTORY:  family history includes CAD in her brother; Diabetes Mellitus II in her mother. SOCIAL HISTORY:  reports that she has never smoked. She has never used smokeless tobacco. She reports that she does not drink alcohol or use drugs.  REVIEW OF SYSTEMS:   Unable to obtain due to critical illness   VITAL SIGNS: Temp:  [98.2 F (36.8 C)-98.8 F (37.1 C)] 98.8 F (37.1 C) (08/30 1030) Pulse Rate:  [83-108] 98 (08/30 1100) Resp:  [21-26] 25 (08/30 1100) BP: (109-143)/(65-77) 114/65 (08/30 1100) SpO2:  [98 %-100 %] 100 % (  08/30 1530) FiO2 (%):  [30 %-35 %] 30 % (08/30 1129) Weight:  [42.7 kg-43.1 kg] 42.7 kg (08/30 1030)  Physical Examination:  Awake and oriented.  Nonfocal neurological exam Tolerating BiPAP, no distress, bilateral equal air entry with no adventitious sounds. S1 & S2 are audible with no murmur. Benign abdominal exam with normal peristalsis. Wasted extremities with  no edema   ASSESSMENT / PLAN: Anterolateral STEMI.  Conservative management as per cardiology considering the patient's age and the fragility. -Aspirin + BB + statins + heparin drip -Management as per cardiology.  CHF. -Diuresis and watch for volume overload  Acute respiratory failure with COPD.  Tolerating BiPAP.  History of steroid dependency -Monitor ABG, optimize BiPAP settings and consider intubation if needed. -Continue with the steroids and Dulera.  Prerenal azotemia with mild hyperkalemia due to intravascular volume depletion and catabolic effect of the steroids -Optimize meds, hydration, monitor renal panel and urine output  Atelectasis and possible pneumonia with right lower airspace disease on the right pleural effusion -Empiric Zosyn and DC levofloxacin.  Monitor CXR + CBC + FiO2  Questionable sepsis with lactic acidemia -Empiric Zosyn, monitor procalcitonin and lactic acid. -Follow his blood culture.  Dyslipidemia on statin  History of CVA with subdural hematoma.  On Keppra  Hypertension. -Optimize antihypertensives and monitor hemodynamics  Anxiety -Optimize Xanax  Full code  DVT and GI prophylaxis.  Continue with supportive care.  Critical care time 40 minutes

## 2018-02-01 NOTE — ED Provider Notes (Signed)
[facility] Emergency Department Provider Note       Time seen: ----------------------------------------- 7:16 AM on 02/01/2018 -----------------------------------------  Level V caveat: History/ROS limited by shortness of breath and the patient is on CPAP I have reviewed the triage vital signs and the nursing notes.  HISTORY    Chief Complaint No chief complaint on file.    HPI Dana Austin is a 84 y.o. female with a history of CVA and subdural hematoma who presents to the ED for respiratory distress.  EMS was called out for respiratory difficulty.  Sats were 60% on room air.  She was placed on nonrebreather and switched over to CPAP in route by EMS.  Patient states that the CPAP has been helping her breathe better.  No further information is available.  Family have reported she had been sick for several days but this cannot be verified.  Past Medical History:  Diagnosis Date  . Stroke ([place])   . Subdural hematoma [facility])     Patient Active Problem List   Diagnosis Date Noted  . Tachycardia 04/06/2015  . Sepsis ([place]) 04/05/2015    Past Surgical History:  Procedure Laterality Date  . none      Allergies Meloxicam  Social History Social History   Tobacco Use  . Smoking status: Never Smoker  . Smokeless tobacco: Never Used  Substance Use Topics  . Alcohol use: No    Comment: occasional  . Drug use: No   Review of Systems Respiratory: Positive for shortness of breath  All systems negative/normal/unremarkable except as stated in the HPI  ____________________________________________   PHYSICAL EXAM:  VITAL SIGNS: ED Triage Vitals  Enc Vitals Group     BP      Pulse      Resp      Temp      Temp src      SpO2      Weight      Height      Head Circumference      Peak Flow      Pain Score      Pain Loc      Pain Edu?      Excl. in [place]?    Constitutional: Alert and oriented.  Mild to moderate distress Eyes: Conjunctivae  are normal. Normal extraocular movements. ENT   Head: Normocephalic and atraumatic.   Nose: No congestion/rhinnorhea.   Mouth/Throat: Mucous membranes are moist.   Neck: No stridor. Cardiovascular: Normal rate, regular rhythm. No murmurs, rubs, or gallops. Respiratory: Tachypnea with diminished breath sounds, particular on the left, scattered rhonchi Gastrointestinal: Soft and nontender. Normal bowel sounds Musculoskeletal: Nontender with normal range of motion in extremities.  Bilateral lower extremity edema is noted Neurologic:  Normal speech and language. No gross focal neurologic deficits are appreciated.  Skin:  Skin is warm, dry and intact. No rash noted. Psychiatric: Anxious mood and affect ____________________________________________  EKG: Interpreted by me.  Sinus tachycardia with a rate of 111 bpm, lateral infarct which is likely acute with ST elevations in lead V4 and V5 as well as 1 and aVL.  There is extensive artifact.  Repeat EKG reveals sinus rhythm with a rate of 96 bpm, anterior lateral ST elevation with leads I and aVL as well as V4 and V5 revealing ST elevation  ____________________________________________  ED COURSE:  As part of my medical decision making, I reviewed the following data within the [place] History obtained from family if available, nursing  notes, old chart and ekg, as well as notes from prior ED visits. Patient presented for respiratory distress, we will assess with labs and imaging as indicated at this time.   Procedures ____________________________________________   LABS (pertinent positives/negatives)  Labs Reviewed  CBC WITH DIFFERENTIAL/PLATELET - Abnormal; Notable for the following components:      Result Value   RDW 16.0 (*)    All other components within normal limits  BLOOD GAS, VENOUS - Abnormal; Notable for the following components:   pH, Ven 7.18 (*)    pCO2, Ven 68 (*)    Acid-base deficit 4.7 (*)     All other components within normal limits  LACTIC ACID, PLASMA - Abnormal; Notable for the following components:   Lactic Acid, Venous 3.2 (*)    All other components within normal limits  BASIC METABOLIC PANEL - Abnormal; Notable for the following components:   Potassium 5.2 (*)    Glucose, Bld 208 (*)    BUN 25 (*)    All other components within normal limits  TROPONIN I - Abnormal; Notable for the following components:   Troponin I 0.32 (*)    All other components within normal limits  CULTURE, BLOOD (ROUTINE X 2)  CULTURE, BLOOD (ROUTINE X 2)  BRAIN NATRIURETIC PEPTIDE   CRITICAL CARE Performed by: Laurence Aly   Total critical care time: 30 minutes  Critical care time was exclusive of separately billable procedures and treating other patients.  Critical care was necessary to treat or prevent imminent or life-threatening deterioration.  Critical care was time spent personally by me on the following activities: development of treatment plan with patient and/or surrogate as well as nursing, discussions with consultants, evaluation of patient's response to treatment, examination of patient, obtaining history from patient or surrogate, ordering and performing treatments and interventions, ordering and review of laboratory studies, ordering and review of radiographic studies, pulse oximetry and re-evaluation of patient's condition.  RADIOLOGY Images were viewed by me  Chest x-ray IMPRESSION: Bilateral effusions increased from the prior exam with bibasilar atelectatic changes. Mild asymmetric density is noted on the right which may represent interstitial edema. ____________________________________________  DIFFERENTIAL DIAGNOSIS   COPD, CHF, pneumonia, pneumothorax, pleural effusion PE  FINAL ASSESSMENT AND PLAN  Acute respiratory distress, ST elevation MI   Plan: The patient had presented for auditory distress and was brought in on CPAP. Patient's labs  revealed elevated troponin and elevated lactic acid level.  Patient's imaging revealed bilateral effusions.  I did give a small dose of IV Lasix.  Initial EKG was concerning for STEMI, repeat EKG was confirmatory for ST elevation.  It is unknown as to the exact time of onset.  There are Q waves on her EKG.  She was started on aspirin as well as heparin.  I will discuss with cardiology and we will activate the STEMI team.   Laurence Aly, MD   Note: This note was generated in part or whole with voice recognition software. Voice recognition is usually quite accurate but there are transcription errors that can and very often do occur. I apologize for any typographical errors that were not detected and corrected.     Earleen Newport, MD 02/01/18 [phone]

## 2018-02-01 NOTE — ED Triage Notes (Signed)
Pt presents today via [facility] from home for [facility]. Pt has been experiencing all night. Pt was with family. Pt arrived on Cpap EDP at bedside.

## 2018-02-01 NOTE — Consult Note (Signed)
Cardiology Consult    Patient ID: Dana Austin MRN: [id number], DOB/AGE: 1924/10/05   Admit date: 02/01/2018 Date of Consult: 02/01/2018  Primary Physician: Tracie Harrier, MD Primary Cardiologist: New - seen by Johnny Bridge, MD  Requesting Provider: Chauncey Cruel. Posey Pronto, MD  Patient Profile    Dana Austin is a 84 y.o. female with a history of COPD/asthma on chronic prednisone, depression, hypertension, GERD, left MCA stroke, hyperlipidemia, paroxysmal atrial fibrillation, history of fall with subdural hematoma, and anxiety, who is being seen today for the evaluation of anterolateral STEMI with respiratory failure at the request of Dr. Posey Pronto.  Past Medical History   Past Medical History:  Diagnosis Date  . Anxiety   . Asthma   . COPD (chronic obstructive pulmonary disease) ([place])    a. Chronic prednisone.  . Depression   . Essential hypertension   . GERD (gastroesophageal reflux disease)   . History of cardiac cath    a. 10/2009 Cath: reportedly nl.  . History of echocardiogram    a. 11/2014 Echo: Nl LVEF/RVEF. Triv AI/TR ([facility]).  . History of ischemic left MCA stroke    a. 08/2014 CVA in [place], [place] - treated w/ TPA. Residual R hemiparesis and aphasia.  . Hyperlipidemia   . Insomnia   . Lower extremity cellulitis    a. Dx 01/24/2018 - outpt Keflex.  . Onychomycosis   . PAF (paroxysmal atrial fibrillation) ([place])   . Skin cancer   . Subdural hematoma ([place])    a. 11/2014 in setting of syncope/fall.  . Syncope    a. 11/2014 syncope w/ fall and resultant T4 compression fx and SDH. Felt to be 2/2 overmedication and dehydration.  . Vitamin D deficiency     Past Surgical History:  Procedure Laterality Date  . none       Allergies  Allergies  Allergen Reactions  . Meloxicam Shortness Of Breath    Pt is not aware she is allergic to this medication. She "becomes short of breath all the time"     History of Present Illness    84 year old female with the above past  medical history including COPD/asthma on chronic prednisone and inhaler therapy, depression, anxiety, hypertension, GERD, hyperlipidemia, PAF, left MCA stroke, and history of syncope with fall and subdural hematoma in 2016.  Per primary care notes, she had previously undergone cardiac catheterization at the time of diagnosis of atrial fibrillation in May 2011.  This was apparently normal.  Family members present with her today have no recollection of this occurring.  Patient is currently on CPAP and unable to answer questions in detail.  She does not have any recent cardiac history and has never been seen by cardiology locally.  She currently lives with her daughter [name].  She was recently seen by primary care on August 22 in the setting of increasing lower extremity swelling and redness.  She was diagnosed with cellulitis and placed on Keflex.  With antibiotic therapy, family noticed improvement in swelling and redness.  She was in her usual state of health until the afternoon of August 29, when she had an episode of significant dyspnea that improved following inhaler therapy.  She had recurrent dyspnea at about 7 PM which lasted a little longer but again, improved with an inhaler.  At 2 AM she awoke with worsening dyspnea that seem to improve once again following inhalers but then when she awoke at 4 AM, family noticed that she was in much more distress and could hear  gurgling.  At that point, EMS was called.She was saturating at 60% on room air.  She was placed on a nonrebreather and then this was switched over to CPAP in route to the ER.  Upon arrival in the emergency department, she was noted to have1 mm of ST segment elevation in leads I, aVL, V4, and V5 with subtle elevation in V2 and ST depression in leads III and aVF.  A code STEMI was activated and she was seen by Dr. Fletcher Anon.  After discussion with family, decision was made not to pursue aggressive measures such as emergent catheterization.  The patient  currently denies chest pain but continues to require CPAP.  Family at bedside.  She is hemodynamically stable.  Inpatient Medications    . acetaminophen  975 mg Oral TID  . [START ON 02/02/2018] aspirin  325 mg Oral Daily  . atorvastatin  40 mg Oral q1800  . cephALEXin  500 mg Oral TID  . ipratropium-albuterol  3 mL Nebulization Q4H  . loratadine  10 mg Oral Daily  . metoprolol succinate  25 mg Oral Daily  . mometasone-formoterol  2 puff Inhalation BID  . multivitamin with minerals  1 tablet Oral Daily  . pantoprazole  40 mg Oral Daily  . PARoxetine  10 mg Oral Daily  . [START ON 02/02/2018] predniSONE  2.5 mg Oral Q breakfast    Family History    Family History  Problem Relation Age of Onset  . CAD Brother   . Diabetes Mellitus II Mother    She indicated that the status of her mother is unknown. She indicated that the status of her brother is unknown.   Social History    Social History   Socioeconomic History  . Marital status: Widowed    Spouse name: Not on file  . Number of children: Not on file  . Years of education: Not on file  . Highest education level: Not on file  Occupational History  . Not on file  Social Needs  . Financial resource strain: Not on file  . Food insecurity:    Worry: Not on file    Inability: Not on file  . Transportation needs:    Medical: Not on file    Non-medical: Not on file  Tobacco Use  . Smoking status: Never Smoker  . Smokeless tobacco: Never Used  Substance and Sexual Activity  . Alcohol use: No    Comment: occasional  . Drug use: No  . Sexual activity: Not on file  Lifestyle  . Physical activity:    Days per week: Not on file    Minutes per session: Not on file  . Stress: Not on file  Relationships  . Social connections:    Talks on phone: Not on file    Gets together: Not on file    Attends religious service: Not on file    Active member of club or organization: Not on file    Attends meetings of clubs or  organizations: Not on file    Relationship status: Not on file  . Intimate partner violence:    Fear of current or ex partner: Not on file    Emotionally abused: Not on file    Physically abused: Not on file    Forced sexual activity: Not on file  Other Topics Concern  . Not on file  Social History Narrative  . Not on file     Review of Systems    General:  No  chills, fever, night sweats or weight changes.  Cardiovascular:  No chest pain, +++ dyspnea on exertion, recent lower extremity swelling which has improved following antibiotic therapy.  +++ orthopnea overnight, no palpitations, +++ paroxysmal nocturnal dyspnea. Dermatological: No rash, lesions/masses Respiratory: No cough, +++ dyspnea Urologic: No hematuria, dysuria Abdominal:   No nausea, vomiting, diarrhea, bright red blood per rectum, melena, or hematemesis Neurologic:  No visual changes, wkns, changes in mental status. All other systems reviewed and are otherwise negative except as noted above.  Physical Exam    Blood pressure 109/66, pulse 98, temperature 98.8 F (37.1 C), temperature source Axillary, resp. rate (!) 21, height 5\' 5"  (1.651 m), weight 42.7 kg, SpO2 99 %.  General: Pleasant, NAD Psych: Flat affect. Neuro: Alert and oriented X 3. Moves all extremities spontaneously. HEENT: Normal  Neck: Supple without bruits or JVD. Lungs:  Resp regular and unlabored, CTA. Heart: RRR no s3, s4, or murmurs. Abdomen: Soft, non-tender, non-distended, BS + x 4.  Extremities: No clubbing, cyanosis.  Trace lower extremity edema. DP/PT/Radials 2+ and equal bilaterally.  Labs     Recent Labs    02/01/18 0722  TROPONINI 0.32*   Lab Results  Component Value Date   WBC 10.0 02/01/2018   HGB 14.7 02/01/2018   HCT 45.3 02/01/2018   MCV 91.1 02/01/2018   PLT 285 02/01/2018    Recent Labs  Lab 02/01/18 0722  NA 140  K 5.2*  CL 104  CO2 26  BUN 25*  CREATININE 0.82  CALCIUM 8.9  GLUCOSE 208*    Radiology  Studies    Dg Chest Port 1 View  Result Date: 02/01/2018 CLINICAL DATA:  Shortness of breath EXAM: PORTABLE CHEST 1 VIEW COMPARISON:  04/04/2015 FINDINGS: Cardiac shadow remains enlarged. Aortic calcifications are again seen. Small bilateral pleural effusions are noted with likely underlying basilar atelectasis. Some interstitial changes are noted on the right which may represent some mild interstitial edema. No bony abnormality is noted. IMPRESSION: Bilateral effusions increased from the prior exam with bibasilar atelectatic changes. Mild asymmetric density is noted on the right which may represent interstitial edema. Electronically Signed   By: Inez Catalina M.D.   On: 02/01/2018 07:48    ECG & Cardiac Imaging    Regular sinus rhythm, 96, 1 to 2 mm ST segment elevation in leads I, aVL, the V4 and V5.  Subtle ST elevation in lead II with otherwise inferior ST depression.  Assessment & Plan    1.  Acute anterolateral ST segment elevation myocardial infarction: Patient presented to the ED this morning after worsening respiratory failure that awoke her from sleep.  Patient was saturating at 6% on room air upon EMS arrival and is currently requiring CPAP.  ECG with 1 mm ST elevation in 1, aVL, V4, V5.  Patient has not currently complaining of chest pain and is hemodynamically stable.  She has been evaluated by interventional cardiology and family and staff and collectively agreed to pursue conservative measures.  She will be admitted to ICU.  Continue to cycle enzymes and continue heparin infusion.  Continue aspirin, statin, beta-blocker.  Follow-up echocardiogram.  2.  Acute respiratory failure: In the setting of above.  Chest x-ray with some suggestion of interstitial edema.  She is clear on exam with flat neck veins.  BNP is elevated at 1990 in setting of above.  At this time, I do not think she requires additional diuresis (he received 20 mill grams of IV Lasix in the ED).  3.  Essential  hypertension: On beta-blocker therapy at home.  Continue as tolerated.  4.  Hyperlipidemia: Continue statin therapy.  5.  Hyperkalemia: Mild-5.2.  Follow.  Signed, Murray Hodgkins, NP 02/01/2018, 12:15 PM  For questions or updates, please contact   Please consult [email or web address] for contact info under Cardiology/STEMI.

## 2018-02-01 NOTE — Progress Notes (Signed)
Advanced care plan.  Purpose of the Encounter: CODE STATUS  Parties in Attendance: patient and son  Patient's Decision Capacity: Intact  Subjective/Patient's story: Patient is a 84 year old with history of COPD, essential hypertension, previous history of stroke presents with acute respiratory failure   Objective/Medical story I discussed with the patient and her family regarding desires cardiac and pulmonary resuscitation.  According to patient and her family she has been doing well.  And would like everything done.  Goals of care determination:  Full code   CODE STATUS: Full code   Time spent discussing advanced care planning: 16 minutes

## 2018-02-01 NOTE — Progress Notes (Signed)
*  PRELIMINARY RESULTS* Echocardiogram 2D Echocardiogram has been performed.  Dana Austin 02/01/2018, 1:54 PM

## 2018-02-01 NOTE — Progress Notes (Signed)
Patient placed on 3l [place] from bipap a this time

## 2018-02-02 DIAGNOSIS — I255 Ischemic cardiomyopathy: Secondary | ICD-10-CM

## 2018-02-02 LAB — HEPARIN LEVEL (UNFRACTIONATED): HEPARIN UNFRACTIONATED: 0.36 [IU]/mL (ref 0.30–0.70)

## 2018-02-02 LAB — BASIC METABOLIC PANEL
Anion gap: 8 (ref 5–15)
BUN: 27 mg/dL — AB (ref 8–23)
CHLORIDE: 103 mmol/L (ref 98–111)
CO2: 28 mmol/L (ref 22–32)
Calcium: 8.7 mg/dL — ABNORMAL LOW (ref 8.9–10.3)
Creatinine, Ser: 0.75 mg/dL (ref 0.44–1.00)
GFR calc non Af Amer: >60 mL/min (ref >60)
Glucose, Bld: 144 mg/dL — ABNORMAL HIGH (ref 70–99)
POTASSIUM: 4 mmol/L (ref 3.5–5.1)
Sodium: 139 mmol/L (ref 135–145)

## 2018-02-02 LAB — CBC
HEMATOCRIT: 36 % (ref 35.0–47.0)
HEMOGLOBIN: 12.1 g/dL (ref 12.0–16.0)
MCH: 29.9 pg (ref 26.0–34.0)
MCHC: 33.7 g/dL (ref 32.0–36.0)
MCV: 88.7 fL (ref 80.0–100.0)
Platelets: 255 10*3/uL (ref 150–440)
RBC: 4.06 MIL/uL (ref 3.80–5.20)
RDW: 15.1 % — ABNORMAL HIGH (ref 11.5–14.5)
WBC: 13.7 10*3/uL — ABNORMAL HIGH (ref 3.6–11.0)

## 2018-02-02 LAB — LIPID PANEL
CHOL/HDL RATIO: 2 ratio
CHOLESTEROL: 136 mg/dL (ref 0–200)
HDL: 68 mg/dL (ref >40)
LDL Cholesterol: 62 mg/dL (ref 0–99)
TRIGLYCERIDES: 32 mg/dL (ref <150)
VLDL: 6 mg/dL (ref 0–40)

## 2018-02-02 LAB — MAGNESIUM: Magnesium: 2 mg/dL (ref 1.7–2.4)

## 2018-02-02 LAB — PHOSPHORUS: Phosphorus: 3.9 mg/dL (ref 2.5–4.6)

## 2018-02-02 LAB — PROCALCITONIN: Procalcitonin: 0.83 ng/mL

## 2018-02-02 LAB — TROPONIN I: Troponin I: 1.81 ng/mL (ref <0.03)

## 2018-02-02 LAB — LACTIC ACID, PLASMA: Lactic Acid, Venous: 1.3 mmol/L (ref 0.5–1.9)

## 2018-02-02 MED ORDER — IPRATROPIUM-ALBUTEROL 0.5-2.5 (3) MG/3ML IN SOLN
3.0000 mL | Freq: Three times a day (TID) | RESPIRATORY_TRACT | Status: DC
  Administered 2018-02-02 – 2018-02-04 (×9): 3 mL via RESPIRATORY_TRACT
  Filled 2018-02-02 (×12): qty 3

## 2018-02-02 NOTE — Progress Notes (Signed)
Progress Note  Patient Name: Dana Austin Date of Encounter: 02/02/2018  Primary Cardiologist: new to [facility]  Subjective   No significant chest pain overnight She reports improved shortness of breath Has not been ambulating medical power of attorney currently in [place] Son at the bedside reports she is full code Echocardiogram detailing ejection fraction less than 20% global hypokinesis concerning for ischemic cardiomyopathy and severe underlying coronary disease He reports he did relay this message to his sister who is in [place] making the decisions  Inpatient Medications    Scheduled Meds: . aspirin  325 mg Oral Daily  . atorvastatin  40 mg Oral q1800  . famotidine  20 mg Oral Daily  . ipratropium-albuterol  3 mL Nebulization TID  . loratadine  10 mg Oral Daily  . metoprolol succinate  25 mg Oral Daily  . mometasone-formoterol  2 puff Inhalation BID  . multivitamin with minerals  1 tablet Oral Daily  . pantoprazole  40 mg Oral Daily  . PARoxetine  10 mg Oral Daily  . predniSONE  2.5 mg Oral Q breakfast   Continuous Infusions: . sodium chloride 10 mL/hr at 02/02/18 0800  . heparin 500 Units/hr (02/02/18 0800)  . piperacillin-tazobactam (ZOSYN)  IV 3.375 g (02/02/18 1044)   PRN Meds: acetaminophen **OR** acetaminophen, ALPRAZolam, nitroGLYCERIN   Vital Signs    Vitals:   02/02/18 0600 02/02/18 0800 02/02/18 0904 02/02/18 1332  BP: 98/61 (!) 145/54 (!) 93/52   Pulse: 80 93 96 77  Resp: 18 16 18 16   Temp:  98.6 F (37 C) 97.9 F (36.6 C)   TempSrc:  Oral Oral   SpO2: 97% 97% 95% 97%  Weight:      Height:        Intake/Output Summary (Last 24 hours) at 02/02/2018 1625 Last data filed at 02/02/2018 1427 Gross per 24 hour  Intake 678.1 ml  Output 100 ml  Net 578.1 ml   Filed Weights   02/01/18 0718 02/01/18 1030  Weight: 43.1 kg 42.7 kg    Telemetry    normal sinus rhythm - Personally Reviewed  ECG      Physical Exam   GEN: No acute  distress.  Frail, thin Neck: No JVD Cardiac: RRR, no murmurs, rubs, or gallops.  Respiratory: Clear to auscultation bilaterally. GI: Soft, nontender, non-distended  MS: No edema; No deformity. Neuro:  Nonfocal  Psych: confusion, smiling but did not seem to follow what we were saying but able to answer questions  Labs    Chemistry Recent Labs  Lab 02/01/18 0722 02/01/18 2024 02/02/18 0243  NA 140 137 139  K 5.2* 4.8 4.0  CL 104 101 103  CO2 26 29 28   GLUCOSE 208* 201* 144*  BUN 25* 25* 27*  CREATININE 0.82 1.01* 0.75  CALCIUM 8.9 8.9 8.7*  GFRNONAA >60 47* >60  GFRAA >60 55* >60  ANIONGAP 10 7 8      Hematology Recent Labs  Lab 02/01/18 0722 02/02/18 0620  WBC 10.0 13.7*  RBC 4.97 4.06  HGB 14.7 12.1  HCT 45.3 36.0  MCV 91.1 88.7  MCH 29.6 29.9  MCHC 32.5 33.7  RDW 16.0* 15.1*  PLT 285 255    Cardiac Enzymes Recent Labs  Lab 02/01/18 0722 02/01/18 2024 02/02/18 0243  TROPONINI 0.32* 2.64* 1.81*   No results for input(s): TROPIPOC in the last 168 hours.   BNP Recent Labs  Lab 02/01/18 0722  BNP 1,990.0*     DDimer No results  for input(s): DDIMER in the last 168 hours.   Radiology    Dg Chest Port 1 View  Result Date: 02/01/2018 CLINICAL DATA:  Shortness of breath EXAM: PORTABLE CHEST 1 VIEW COMPARISON:  04/04/2015 FINDINGS: Cardiac shadow remains enlarged. Aortic calcifications are again seen. Small bilateral pleural effusions are noted with likely underlying basilar atelectasis. Some interstitial changes are noted on the right which may represent some mild interstitial edema. No bony abnormality is noted. IMPRESSION: Bilateral effusions increased from the prior exam with bibasilar atelectatic changes. Mild asymmetric density is noted on the right which may represent interstitial edema. Electronically Signed   By: Inez Catalina M.D.   On: 02/01/2018 07:48    Cardiac Studies   Echocardiogram - Left ventricle: The cavity size was severely dilated.  Systolic   function was severely reduced. The estimated ejection fraction   was less than 20% Severe global akinesis with basal wall motion   best preserved. LV appears dilated and globular, possible regions   of aneurysm. The study is not technically sufficient to allow   evaluation of LV diastolic function. - Aortic valve: There was mild regurgitation. - Left atrium: The atrium was normal in size. - Right ventricle: Systolic function was normal. - Pulmonary arteries: Systolic pressure was within the normal   range.  Patient Profile     Ms. Lipps is a 84 yo old woman with history of COPD on chronic prednisone, stroke, paroxysmal atrial fibrillation, prior falls with subdural hematoma, presented to the hospital with respiratory distress, markedly abnormal EKG concerning for ischemia, cardiac catheterization declined by interventional team given her frail state  Assessment & Plan    A/P: STEMI  Anterolateral leads Not taken to the cardiac catheterization lab given age, frail, felt to be high risk Troponin peak of 2.64 trending down Echocardiogram with ejection fraction less than 20% Likely has severe multivessel oronary disease Long discussion with son at the bedside Patient does not seem to follow much the conversation. Daughter has medical power of attorney -we have recommended conservative therapy aspirin, beta blocker, statin Little room on her blood pressure to add additional medications Would continue heparin infusion through tomorrow -----would consider palliative consultation Ideally should be on hospice given severity of her cardiac function Daughter who makes decisions is currently in [place]  Acute respiratory distress Dilated ischemic cardiomyopathy BNP 2000 Improved symptoms on Lasix Would recommend Lasix  2 or 3 times per week Fluid intake volume unclear  COPD On chronic prednisone Chest x-ray with bilateral effusions that have increased, interstitial  edema 2 doses of Lasix have been given with improved symptoms   Total encounter time more than 35 minutes  Greater than 50% was spent in counseling and coordination of care with the patient   For questions or updates, please contact [place] Please consult [email or web address] for contact info under Cardiology/STEMI.      Signed, Ida Rogue, MD  02/02/2018, 4:25 PM

## 2018-02-02 NOTE — Plan of Care (Signed)
  Problem: Safety: Goal: Ability to remain free from injury will improve Outcome: Progressing   

## 2018-02-02 NOTE — Evaluation (Signed)
Physical Therapy Evaluation Patient Details Name: Dana Austin MRN: [id number] DOB: May 14, 1925 Today's Date: 02/02/2018   History of Present Illness  Pt is a 84 y.o. female presenting to hospital 02/01/18 with respiratory distress (sats 60% room air) and initially placed on Bipap.  Recent treatment R LE cellulitis.  Pt admitted with acute anterolateral STEMI (being medically managed) and acute hypoxic respiratory failure d/t acute CHF.  PMH includes h/o L MCA CVA, SDH, and tachycardia.  Clinical Impression  Prior to hospital admission, pt was ambulatory shorter distances with UE support (furniture cruised typically in home and 4ww in community.  Pt currently living in daughter's  home but that daughter is on vacation and her son and daughter in law came to stay with pt.  Currently pt is CGA to min assist to stand with RW.  Pt reporting being lightheaded first attempt standing so pt assisted back to sitting on edge of bed.  2nd attempt standing pt reporting no lightheadedness.  Pt noted to be putting majority of weight through L LE (pt with h/o stroke affecting R side: R LE appearing weaker and decreased R knee flexion ROM noted).  Able to take a step with R LE in place but unable to take step with L LE (d/t R LE weakness and unable to accept increased weight through R LE to step with L LE).  Pt assisted back to bed for safety.  O2 sats 93% or greater during session on room air.  Pt reporting no pain during session.  Pt would benefit from skilled PT to address noted impairments and functional limitations (see below for any additional details).  Upon hospital discharge, recommend pt discharge to [place].    Follow Up Recommendations SNF    Equipment Recommendations  Rolling walker with 5" wheels    Recommendations for Other Services OT consult     Precautions / Restrictions Precautions Precautions: Fall Restrictions Weight Bearing Restrictions: No      Mobility  Bed Mobility Overal bed  mobility: Needs Assistance Bed Mobility: Supine to Sit;Sit to Supine     Supine to sit: Min guard;HOB elevated Sit to supine: Mod assist;Max assist;+2 for physical assistance   General bed mobility comments: increased effort for pt to perform on own supine to sit; 2 assist for safety sit to supine and to boost up in bed  Transfers Overall transfer level: Needs assistance Equipment used: Rolling walker (2 wheeled) Transfers: Sit to/from Stand Sit to Stand: Min assist;Min guard         General transfer comment: 1st trial min assist to stand with RW; 2nd trial CGA to stand with RW  Ambulation/Gait Ambulation/Gait assistance: Min assist Gait Distance (Feet): (0) Assistive device: Rolling walker (2 wheeled)       General Gait Details: Pt able to take a step in place with R LE but unable to take step with L LE; use of RW  Stairs            Wheelchair Mobility    Modified Rankin (Stroke Patients Only)       Balance Overall balance assessment: Needs assistance Sitting-balance support: Bilateral upper extremity supported;Feet supported Sitting balance-Leahy Scale: Poor Sitting balance - Comments: requires B UE support for static sitting balance   Standing balance support: Bilateral upper extremity supported Standing balance-Leahy Scale: Poor Standing balance comment: initial posterior lean standing requiring min to mod assist to shift weight forward with cues for use of RW; CGA 2nd trial standing  Pertinent Vitals/Pain Pain Assessment: No/denies pain  HR WFL during session.    Home Living Family/patient expects to be discharged to:: Private residence Living Arrangements: Children Available Help at Discharge: Family Type of Home: House Home Access: Stairs to enter Entrance Stairs-Rails: None Entrance Stairs-Number of Steps: 1 step from garage with no railing (uses wall for support) Home Layout: Two level;Able to live on  main level with bedroom/bathroom Home Equipment: Kasandra Knudsen - single point;Walker - 4 wheels Additional Comments: Pt stays with 3 different children (in 3 different states) for 5 months at a time.  Currently staying with daughter in [place] although she is on vacation (returning on September 8th).  Son and daughter in law from [place] currently staying with pt at daughter's [place] home.    Prior Function Level of Independence: Needs assistance   Gait / Transfers Assistance Needed: Ambulates holding onto furniture during the day (sometimes uses 4ww at night); rarely uses SPC; uses 4ww in community but tends to only do shorter distances.     Comments: Reports no falls in past 6 months.     Hand Dominance        Extremity/Trunk Assessment   Upper Extremity Assessment Upper Extremity Assessment: Generalized weakness    Lower Extremity Assessment Lower Extremity Assessment: RLE deficits/detail;LLE deficits/detail RLE Deficits / Details: able to perform SLR independently; at least 3/5 AROM DF; limited knee flexion AROM (R knee flexion to grossly 80 degrees AROM in sitting--pt reporting being "stiff"); no MMT d/t IV heparin running LLE Deficits / Details: able to perform SLR independently; at least 3/5 AROM hip flexion, knee flexion/extension, and DF (no MMT d/t IV heparin running)    Cervical / Trunk Assessment Cervical / Trunk Assessment: Normal  Communication   Communication: HOH(uses hearing aides per daughter-in-law)  Cognition Arousal/Alertness: Awake/alert Behavior During Therapy: WFL for tasks assessed/performed Overall Cognitive Status: Within Functional Limits for tasks assessed                                        General Comments General comments (skin integrity, edema, etc.): R UE IV site noted to be bleeding at beginning of session (prior to PT assessment); blood outside tape blotted dry and no further bleeding noted during session; nurse aware and reports she had  already discussed it with ICU nurse.  Nursing cleared pt for participation in physical therapy.  Pt agreeable to PT session.  Pt's daughter-in-law and then son present during session.    Exercises  Transfer training   Assessment/Plan    PT Assessment Patient needs continued PT services  PT Problem List Decreased strength;Decreased range of motion;Decreased activity tolerance;Decreased balance;Decreased mobility;Decreased knowledge of use of DME;Decreased knowledge of precautions       PT Treatment Interventions DME instruction;Gait training;Stair training;Functional mobility training;Therapeutic activities;Therapeutic exercise;Balance training;Patient/family education    PT Goals (Current goals can be found in the Care Plan section)  Acute Rehab PT Goals Patient Stated Goal: to be able to walk again PT Goal Formulation: With patient Time For Goal Achievement: 02/16/18 Potential to Achieve Goals: Good    Frequency Min 2X/week   Barriers to discharge Decreased caregiver support      Co-evaluation               AM-PAC PT "6 Clicks" Daily Activity  Outcome Measure Difficulty turning over in bed (including adjusting bedclothes, sheets and blankets)?: A Little Difficulty moving  from lying on back to sitting on the side of the bed? : A Lot Difficulty sitting down on and standing up from a chair with arms (e.g., wheelchair, bedside commode, etc,.)?: Unable Help needed moving to and from a bed to chair (including a wheelchair)?: A Little Help needed walking in hospital room?: A Lot Help needed climbing 3-5 steps with a railing? : Total 6 Click Score: 12    End of Session Equipment Utilized During Treatment: Gait belt Activity Tolerance: Patient tolerated treatment well Patient left: in bed;with call bell/phone within reach;with bed alarm set;with nursing/sitter in room;with family/visitor present Nurse Communication: Mobility status;Precautions PT Visit Diagnosis: Other  abnormalities of gait and mobility (R26.89);Muscle weakness (generalized) (M62.81);Difficulty in walking, not elsewhere classified (R26.2)    Time: 7741-4239 PT Time Calculation (min) (ACUTE ONLY): 47 min   Charges:   PT Evaluation $PT Eval Low Complexity: 1 Low PT Treatments $Therapeutic Activity: 23-37 mins       Leitha Bleak, PT 02/02/18, 11:18 AM [phone]

## 2018-02-02 NOTE — Progress Notes (Signed)
ANTICOAGULATION CONSULT NOTE - Initial Consult  Pharmacy Consult for heparin Indication: chest pain/ACS  Allergies  Allergen Reactions  . Meloxicam Shortness Of Breath    Pt is not aware she is allergic to this medication. She "becomes short of breath all the time"     Patient Measurements: Height: 5\' 5"  (165.1 cm) Weight: 94 lb 2.2 oz (42.7 kg) IBW/kg (Calculated) : 57 Heparin Dosing Weight: 43.1 kg  Vital Signs: Temp: 97.7 F (36.5 C) (08/30 2000) Temp Source: Oral (08/30 2000) BP: 98/61 (08/31 0600) Pulse Rate: 80 (08/31 0600)  Labs: Recent Labs    02/01/18 0722 02/01/18 0942 02/01/18 2024 02/02/18 0243 02/02/18 0620  HGB 14.7  --   --   --  12.1  HCT 45.3  --   --   --  36.0  PLT 285  --   --   --  255  APTT  --  24  --   --   --   LABPROT  --  12.1  --   --   --   INR  --  0.90  --   --   --   HEPARINUNFRC  --   --  0.44  --   --   CREATININE 0.82  --  1.01* 0.75  --   TROPONINI 0.32*  --  2.64* 1.81*  --     Estimated Creatinine Clearance: 30.9 mL/min (by C-G formula based on SCr of 0.75 mg/dL).  Assessment: 84 yo female with history of CVA and subdural hematoma presents to ED for respiratory distress.  Will medically manage at present with heparin and aspirin.  8/30 Heparin infusing at 500 units/hr, first Heparin level at 20:24  resulted at 0.44  Goal of Therapy:  Heparin level 0.3-0.7 units/ml Monitor platelets by anticoagulation protocol: Yes   Plan:  8/31 @ 0644 Heparin level 0.36. Level now therapeutic x2. Will continue with current rate of 500 units/hr.  Will continue to check HL and CBC with AM labs daily.  Pernell Dupre, PharmD, BCPS Clinical Pharmacist 02/02/2018 6:55 AM

## 2018-02-02 NOTE — Progress Notes (Signed)
[facility]                                                                                                                                                                                  Patient Demographics   Dana Austin, is a 84 y.o. female, DOB - 1924-12-02, NUU:[id number]  Admit date - 02/01/2018   Admitting Physician Dustin Flock, MD  Outpatient Primary MD for the patient is Tracie Harrier, MD   LOS - 1  Subjective: Patient admitted with acute respiratory failure doing much better on 3 L of oxygen Denies any chest pain   Review of Systems:   CONSTITUTIONAL: No documented fever. No fatigue, weakness. No weight gain, no weight loss.  EYES: No blurry or double vision.  ENT: No tinnitus. No postnasal drip. No redness of the oropharynx.  RESPIRATORY: No cough, no wheeze, no hemoptysis. No dyspnea.  CARDIOVASCULAR: No chest pain. No orthopnea. No palpitations. No syncope.  GASTROINTESTINAL: No nausea, no vomiting or diarrhea. No abdominal pain. No melena or hematochezia.  GENITOURINARY: No dysuria or hematuria.  ENDOCRINE: No polyuria or nocturia. No heat or cold intolerance.  HEMATOLOGY: No anemia. No bruising. No bleeding.  INTEGUMENTARY: No rashes. No lesions.  MUSCULOSKELETAL: No arthritis. No swelling. No gout.  NEUROLOGIC: No numbness, tingling, or ataxia. No seizure-type activity.  PSYCHIATRIC: No anxiety. No insomnia. No ADD.    Vitals:   Vitals:   02/02/18 0400 02/02/18 0600 02/02/18 0800 02/02/18 0904  BP: (!) 108/54 98/61 (!) 145/54 (!) 93/52  Pulse: 76 80 93 96  Resp: 20 18 16 18   Temp:   98.6 F (37 C) 97.9 F (36.6 C)  TempSrc:   Oral Oral  SpO2: 96% 97% 97% 95%  Weight:      Height:        Wt Readings from Last 3 Encounters:  02/01/18 42.7 kg  06/16/15 45.4 kg  04/06/15 46.9 kg     Intake/Output Summary (Last 24 hours) at 02/02/2018 1234 Last data filed at 02/02/2018 0800 Gross per 24 hour  Intake  438.1 ml  Output 100 ml  Net 338.1 ml    Physical Exam:   GENERAL: Pleasant-appearing in no apparent distress.  HEAD, EYES, EARS, NOSE AND THROAT: Atraumatic, normocephalic. Extraocular muscles are intact. Pupils equal and reactive to light. Sclerae anicteric. No conjunctival injection. No oro-pharyngeal erythema.  NECK: Supple. There is no jugular venous distention. No bruits, no lymphadenopathy, no thyromegaly.  HEART: Regular rate and rhythm,. No murmurs, no rubs, no clicks.  LUNGS: Clear to auscultation bilaterally. No rales or rhonchi. No wheezes.  ABDOMEN: Soft, flat, nontender, nondistended. Has good bowel sounds. No  hepatosplenomegaly appreciated.  EXTREMITIES: No evidence of any cyanosis, clubbing, or peripheral edema.  +2 pedal and radial pulses bilaterally.  NEUROLOGIC: The patient is alert, awake, and oriented x3 with no focal motor or sensory deficits appreciated bilaterally.  SKIN: Moist and warm with no rashes appreciated.  Psych: Not anxious, depressed LN: No inguinal LN enlargement    Antibiotics   Anti-infectives (From admission, onward)   Start     Dose/Rate Route Frequency Ordered Stop   02/01/18 1845  piperacillin-tazobactam (ZOSYN) IVPB 3.375 g     3.375 g 12.5 mL/hr over 240 Minutes Intravenous Every 8 hours 02/01/18 1838     02/01/18 1045  cephALEXin (KEFLEX) capsule 500 mg  Status:  Discontinued     500 mg Oral 3 times daily 02/01/18 1044 02/01/18 1824   02/01/18 0745  levofloxacin (LEVAQUIN) IVPB 750 mg     750 mg 100 mL/hr over 90 Minutes Intravenous  Once 02/01/18 0736 02/01/18 0947      Medications   Scheduled Meds: . aspirin  325 mg Oral Daily  . atorvastatin  40 mg Oral q1800  . famotidine  20 mg Oral Daily  . ipratropium-albuterol  3 mL Nebulization TID  . loratadine  10 mg Oral Daily  . metoprolol succinate  25 mg Oral Daily  . mometasone-formoterol  2 puff Inhalation BID  . multivitamin with minerals  1 tablet Oral Daily  . pantoprazole   40 mg Oral Daily  . PARoxetine  10 mg Oral Daily  . predniSONE  2.5 mg Oral Q breakfast   Continuous Infusions: . sodium chloride 10 mL/hr at 02/02/18 0800  . heparin 500 Units/hr (02/02/18 0800)  . piperacillin-tazobactam (ZOSYN)  IV 3.375 g (02/02/18 1044)   PRN Meds:.acetaminophen **OR** acetaminophen, ALPRAZolam, nitroGLYCERIN   Data Review:   Micro Results Recent Results (from the past 240 hour(s))  Blood culture (routine x 2)     Status: None (Preliminary result)   Collection Time: 02/01/18  7:21 AM  Result Value Ref Range Status   Specimen Description BLOOD LEFT ARM  Final   Special Requests   Final    BOTTLES DRAWN AEROBIC AND ANAEROBIC Blood Culture adequate volume   Culture   Final    NO GROWTH < 24 HOURS Performed at [facility], [address], [place], [place] [address]    Report Status PENDING  Incomplete    Radiology Reports Dg Chest Port 1 View  Result Date: 02/01/2018 CLINICAL DATA:  Shortness of breath EXAM: PORTABLE CHEST 1 VIEW COMPARISON:  04/04/2015 FINDINGS: Cardiac shadow remains enlarged. Aortic calcifications are again seen. Small bilateral pleural effusions are noted with likely underlying basilar atelectasis. Some interstitial changes are noted on the right which may represent some mild interstitial edema. No bony abnormality is noted. IMPRESSION: Bilateral effusions increased from the prior exam with bibasilar atelectatic changes. Mild asymmetric density is noted on the right which may represent interstitial edema. Electronically Signed   By: Inez Catalina M.D.   On: 02/01/2018 07:48     CBC Recent Labs  Lab 02/01/18 0722 02/02/18 0620  WBC 10.0 13.7*  HGB 14.7 12.1  HCT 45.3 36.0  PLT 285 255  MCV 91.1 88.7  MCH 29.6 29.9  MCHC 32.5 33.7  RDW 16.0* 15.1*  LYMPHSABS 3.2  --   MONOABS 0.4  --   EOSABS 0.3  --   BASOSABS 0.1  --     Chemistries  Recent Labs  Lab 02/01/18 0722 02/01/18 2024 02/02/18  0243  NA 140 137 139   K 5.2* 4.8 4.0  CL 104 101 103  CO2 26 29 28   GLUCOSE 208* 201* 144*  BUN 25* 25* 27*  CREATININE 0.82 1.01* 0.75  CALCIUM 8.9 8.9 8.7*  MG  --   --  2.0   ------------------------------------------------------------------------------------------------------------------ estimated creatinine clearance is 30.9 mL/min (by C-G formula based on SCr of 0.75 mg/dL). ------------------------------------------------------------------------------------------------------------------ No results for input(s): HGBA1C in the last 72 hours. ------------------------------------------------------------------------------------------------------------------ Recent Labs    02/02/18 0243  CHOL 136  HDL 68  LDLCALC 62  TRIG 32  CHOLHDL 2.0   ------------------------------------------------------------------------------------------------------------------ No results for input(s): TSH, T4TOTAL, T3FREE, THYROIDAB in the last 72 hours.  Invalid input(s): FREET3 ------------------------------------------------------------------------------------------------------------------ No results for input(s): VITAMINB12, FOLATE, FERRITIN, TIBC, IRON, RETICCTPCT in the last 72 hours.  Coagulation profile Recent Labs  Lab 02/01/18 0942  INR 0.90    No results for input(s): DDIMER in the last 72 hours.  Cardiac Enzymes Recent Labs  Lab 02/01/18 0722 02/01/18 2024 02/02/18 0243  TROPONINI 0.32* 2.64* 1.81*   ------------------------------------------------------------------------------------------------------------------ Invalid input(s): POCBNP    Assessment & Plan   IMPRESSION AND PLAN: Patient is 84 year old with COPD not on any oxygen presenting with shortness of breath  1.  Acute hypoxic respiratory failure due to acute systolic CHF  Continue Lasix Echocardiogram of the heart shows EF of 20% in 2016 EF was normal Cardiology has already seen the patient  2.  Acute ST MI Continue heparin   Continue aspirin Lipitor 40 Continue metoprolol  3.  COPD continue neb  4.  Recent lower extremity cellulitis continue Keflex  5.  Essential hypertension continue metoprolol  6, miscellaneous DVT prophylaxis with heparin      Code Status Orders  (From admission, onward)         Start     Ordered   02/01/18 1045  Full code  Continuous     02/01/18 1044        Code Status History    Date Active Date Inactive Code Status Order ID Comments User Context   04/05/2015 0442 04/06/2015 1907 Full Code [id number]  Harrie Foreman, MD Inpatient           Consults cardiology  DVT Prophylaxis heparin  Lab Results  Component Value Date   PLT 255 02/02/2018     Time Spent in minutes 35 minutes greater than 50% of time spent in care coordination and counseling patient regarding the condition and plan of care.   Dustin Flock M.D on 02/02/2018 at 12:34 PM  Between 7am to 6pm - Pager - [phone]  After 6pm go to [email or web address] - Proofreader  [employer]   Office  [phone]

## 2018-02-03 DIAGNOSIS — R0603 Acute respiratory distress: Secondary | ICD-10-CM

## 2018-02-03 LAB — CBC
HCT: 35.9 % (ref 35.0–47.0)
Hemoglobin: 12.3 g/dL (ref 12.0–16.0)
MCH: 30.1 pg (ref 26.0–34.0)
MCHC: 34.1 g/dL (ref 32.0–36.0)
MCV: 88.3 fL (ref 80.0–100.0)
Platelets: 267 10*3/uL (ref 150–440)
RBC: 4.07 MIL/uL (ref 3.80–5.20)
RDW: 15.2 % — ABNORMAL HIGH (ref 11.5–14.5)
WBC: 11 10*3/uL (ref 3.6–11.0)

## 2018-02-03 LAB — PROCALCITONIN: Procalcitonin: 0.66 ng/mL

## 2018-02-03 LAB — HEPARIN LEVEL (UNFRACTIONATED): Heparin Unfractionated: 0.38 IU/mL (ref 0.30–0.70)

## 2018-02-03 MED ORDER — QUETIAPINE FUMARATE 25 MG PO TABS
25.0000 mg | ORAL_TABLET | Freq: Once | ORAL | Status: AC
  Administered 2018-02-03: 25 mg via ORAL
  Filled 2018-02-03: qty 1

## 2018-02-03 MED ORDER — ASPIRIN 81 MG PO CHEW
81.0000 mg | CHEWABLE_TABLET | Freq: Every day | ORAL | Status: DC
  Administered 2018-02-04 – 2018-02-05 (×2): 81 mg via ORAL
  Filled 2018-02-03 (×2): qty 1

## 2018-02-03 MED ORDER — AMOXICILLIN-POT CLAVULANATE 875-125 MG PO TABS
1.0000 | ORAL_TABLET | Freq: Two times a day (BID) | ORAL | Status: DC
  Administered 2018-02-03 – 2018-02-04 (×3): 1 via ORAL
  Filled 2018-02-03 (×3): qty 1

## 2018-02-03 MED ORDER — ENALAPRIL MALEATE 5 MG PO TABS
5.0000 mg | ORAL_TABLET | Freq: Every day | ORAL | Status: DC
  Administered 2018-02-03: 5 mg via ORAL
  Filled 2018-02-03 (×2): qty 1

## 2018-02-03 NOTE — Progress Notes (Signed)
[facility]                                                                                                                                                                                  Patient Demographics   Dana Austin, is a 84 y.o. female, DOB - [birth year], RJJ:[id number]  Admit date - 02/01/2018   Admitting Physician Dustin Flock, MD  Outpatient Primary MD for the patient is Tracie Harrier, MD   LOS - 2  Subjective: Since yesterday evening patient is now confused unable to arouse   Review of Systems:   CONSTITUTIONAL: Currently sleepy unable to arouse Vitals:   Vitals:   02/03/18 0644 02/03/18 0721 02/03/18 0723 02/03/18 1334  BP: 132/78 123/71    Pulse: 87 88 84   Resp:  18 16   Temp: (!) 97.3 F (36.3 C) 98.1 F (36.7 C)    TempSrc: Oral Oral    SpO2: 96% 95% 93% 98%  Weight: 51.3 kg     Height:        Wt Readings from Last 3 Encounters:  02/03/18 51.3 kg  06/16/15 45.4 kg  04/06/15 46.9 kg     Intake/Output Summary (Last 24 hours) at 02/03/2018 1336 Last data filed at 02/03/2018 0801 Gross per 24 hour  Intake 720.35 ml  Output 0 ml  Net 720.35 ml    Physical Exam:   GENERAL: Currently drowsy HEAD, EYES, EARS, NOSE AND THROAT: Atraumatic, normocephalic. Extraocular muscles are intact. Pupils equal and reactive to light. Sclerae anicteric. No conjunctival injection. No oro-pharyngeal erythema.  NECK: Supple. There is no jugular venous distention. No bruits, no lymphadenopathy, no thyromegaly.  HEART: Regular rate and rhythm,. No murmurs, no rubs, no clicks.  LUNGS: Clear to auscultation bilaterally. No rales or rhonchi. No wheezes.  ABDOMEN: Soft, flat, nontender, nondistended. Has good bowel sounds. No hepatosplenomegaly appreciated.  EXTREMITIES: No evidence of any cyanosis, clubbing, or peripheral edema.  +2 pedal and radial pulses bilaterally.  NEUROLOGIC: Drowsy  sKIN: Moist and warm with no rashes  appreciated.  Psych: Not anxious, depressed LN: No inguinal LN enlargement    Antibiotics   Anti-infectives (From admission, onward)   Start     Dose/Rate Route Frequency Ordered Stop   02/03/18 1000  amoxicillin-clavulanate (AUGMENTIN) 875-125 MG per tablet 1 tablet     1 tablet Oral Every 12 hours 02/03/18 0802     02/01/18 1845  piperacillin-tazobactam (ZOSYN) IVPB 3.375 g  Status:  Discontinued     3.375 g 12.5 mL/hr over 240 Minutes Intravenous Every 8 hours 02/01/18 1838 02/03/18 0802   02/01/18 1045  cephALEXin (KEFLEX) capsule 500 mg  Status:  Discontinued     500 mg Oral 3 times daily 02/01/18 1044 02/01/18 1824   02/01/18 0745  levofloxacin (LEVAQUIN) IVPB 750 mg     750 mg 100 mL/hr over 90 Minutes Intravenous  Once 02/01/18 0736 02/01/18 0947      Medications   Scheduled Meds: . amoxicillin-clavulanate  1 tablet Oral Q12H  . [START ON 02/04/2018] aspirin  81 mg Oral Daily  . atorvastatin  40 mg Oral q1800  . enalapril  5 mg Oral Daily  . famotidine  20 mg Oral Daily  . ipratropium-albuterol  3 mL Nebulization TID  . loratadine  10 mg Oral Daily  . metoprolol succinate  25 mg Oral Daily  . mometasone-formoterol  2 puff Inhalation BID  . multivitamin with minerals  1 tablet Oral Daily  . pantoprazole  40 mg Oral Daily  . PARoxetine  10 mg Oral Daily  . predniSONE  2.5 mg Oral Q breakfast   Continuous Infusions: . sodium chloride 10 mL/hr at 02/02/18 0800  . heparin 500 Units/hr (02/03/18 0422)   PRN Meds:.acetaminophen **OR** acetaminophen, ALPRAZolam, nitroGLYCERIN   Data Review:   Micro Results Recent Results (from the past 240 hour(s))  Blood culture (routine x 2)     Status: None (Preliminary result)   Collection Time: 02/01/18  7:21 AM  Result Value Ref Range Status   Specimen Description BLOOD LEFT ARM  Final   Special Requests   Final    BOTTLES DRAWN AEROBIC AND ANAEROBIC Blood Culture adequate volume   Culture   Final    NO GROWTH 2  DAYS Performed at [facility], [address], [place], [place] [address]    Report Status PENDING  Incomplete    Radiology Reports Dg Chest Port 1 View  Result Date: 02/01/2018 CLINICAL DATA:  Shortness of breath EXAM: PORTABLE CHEST 1 VIEW COMPARISON:  04/04/2015 FINDINGS: Cardiac shadow remains enlarged. Aortic calcifications are again seen. Small bilateral pleural effusions are noted with likely underlying basilar atelectasis. Some interstitial changes are noted on the right which may represent some mild interstitial edema. No bony abnormality is noted. IMPRESSION: Bilateral effusions increased from the prior exam with bibasilar atelectatic changes. Mild asymmetric density is noted on the right which may represent interstitial edema. Electronically Signed   By: Inez Catalina M.D.   On: 02/01/2018 07:48     CBC Recent Labs  Lab 02/01/18 0722 02/02/18 0620 02/03/18 0655  WBC 10.0 13.7* 11.0  HGB 14.7 12.1 12.3  HCT 45.3 36.0 35.9  PLT 285 255 267  MCV 91.1 88.7 88.3  MCH 29.6 29.9 30.1  MCHC 32.5 33.7 34.1  RDW 16.0* 15.1* 15.2*  LYMPHSABS 3.2  --   --   MONOABS 0.4  --   --   EOSABS 0.3  --   --   BASOSABS 0.1  --   --     Chemistries  Recent Labs  Lab 02/01/18 0722 02/01/18 2024 02/02/18 0243  NA 140 137 139  K 5.2* 4.8 4.0  CL 104 101 103  CO2 26 29 28   GLUCOSE 208* 201* 144*  BUN 25* 25* 27*  CREATININE 0.82 1.01* 0.75  CALCIUM 8.9 8.9 8.7*  MG  --   --  2.0   ------------------------------------------------------------------------------------------------------------------ estimated creatinine clearance is 37.1 mL/min (by C-G formula based on SCr of 0.75 mg/dL). ------------------------------------------------------------------------------------------------------------------ No results for input(s): HGBA1C in the last 72 hours. ------------------------------------------------------------------------------------------------------------------ Recent  Labs    02/02/18 0243  CHOL  136  HDL 68  LDLCALC 62  TRIG 32  CHOLHDL 2.0   ------------------------------------------------------------------------------------------------------------------ No results for input(s): TSH, T4TOTAL, T3FREE, THYROIDAB in the last 72 hours.  Invalid input(s): FREET3 ------------------------------------------------------------------------------------------------------------------ No results for input(s): VITAMINB12, FOLATE, FERRITIN, TIBC, IRON, RETICCTPCT in the last 72 hours.  Coagulation profile Recent Labs  Lab 02/01/18 0942  INR 0.90    No results for input(s): DDIMER in the last 72 hours.  Cardiac Enzymes Recent Labs  Lab 02/01/18 0722 02/01/18 2024 02/02/18 0243  TROPONINI 0.32* 2.64* 1.81*   ------------------------------------------------------------------------------------------------------------------ Invalid input(s): POCBNP    Assessment & Plan   IMPRESSION AND PLAN: Patient is 84 year old with COPD not on any oxygen presenting with shortness of breath  1.  Acute encephalopathy and agitation due to acute delirium supportive care explained to the family that this is not uncommon patient her age    45.  Acute hypoxic respiratory failure due to acute systolic CHF  Continue Lasix Echocardiogram of the heart shows EF of 20% in 2016 EF was normal Cardiology has already seen the patient Patient now on room air  3.  Acute ST MI Continue heparin  Continue aspirin Lipitor 40 Continue metoprolol  4.  COPD continue neb oral antibiotic  5.  Recent lower extremity cellulitis currently on oral Augmentin  5.  Essential hypertension continue metoprolol  6, miscellaneous DVT prophylaxis with heparin      Code Status Orders  (From admission, onward)         Start     Ordered   02/01/18 1045  Full code  Continuous     02/01/18 1044        Code Status History    Date Active Date Inactive Code Status Order ID  Comments User Context   04/05/2015 0442 04/06/2015 1907 Full Code [id number]  Harrie Foreman, MD Inpatient        Gnosis poor discussed with the family to consider changing her to a DNR   Consults cardiology  DVT Prophylaxis heparin  Lab Results  Component Value Date   PLT 267 02/03/2018     Time Spent in minutes 35 minutes greater than 50% of time spent in care coordination and counseling patient regarding the condition and plan of care.   Dustin Flock M.D on 02/03/2018 at 1:36 PM  Between 7am to 6pm - Pager - [phone]  After 6pm go to [email or web address] - Proofreader  [employer]   Office  [phone]

## 2018-02-03 NOTE — Clinical Social Work Note (Signed)
Clinical Social Work Assessment  Patient Details  Name: Dana Austin MRN: [id number] Date of Birth: [birth year]  Date of referral:  02/03/18               Reason for consult:  Facility Placement                Permission sought to share information with:  Facility Sport and exercise psychologist, Family Supports Permission granted to share information::  Yes, Verbal Permission Granted  Name::     Myers,Robert Relative [phone], or Brou,Kathy Daughter [phone]  [phone] or Myers,Nancy A Daughter [phone]  [phone]   Agency::  SNF admissions  Relationship::     Contact Information:     Housing/Transportation Living arrangements for the past 2 months:  Single Family Home Source of Information:  Patient, Other (Comment Required) Patient Interpreter Needed:  None Criminal Activity/Legal Involvement Pertinent to Current Situation/Hospitalization:  No - Comment as needed Significant Relationships:  Adult Children, Siblings Lives with:  Adult Children Do you feel safe going back to the place where you live?  No Need for family participation in patient care:  Yes (Comment)  Care giving concerns:  Patient and family would like some short term rehab before she is able to return back home.   Social Worker assessment / plan:  Patient is a 84 year old female who lives with her daughters.  Patient states that she has not been to rehab before, CSW explained what to expect and the process for finding SNF placement.  Patient's daughter in law was at bedside, CSW discussed how insurance will have to approve patient and also process to find facilities that offer short term rehab.  CSW explained how insurance will pay for stay.  Patient's two daughters are [place] for vacation, however the patient's son is able to contact them to make a decision on which SNF they want her to go to.  Patient's daughter in law did not have any other questions, and patient gave CSW permission to begin search in  [place].   Employment status:  Retired Nurse, adult PT Recommendations:  [place] / Referral to community resources:  [place]  Patient/Family's Response to care:  Patient and family are agreeable to going to SNF for short term rehab.  Patient/Family's Understanding of and Emotional Response to Diagnosis, Current Treatment, and Prognosis: Patient is hopeful that she will not have to be in SNF for very long.  Emotional Assessment Appearance:  Appears stated age Attitude/Demeanor/Rapport:    Affect (typically observed):  Appropriate Orientation:  Oriented to Self, Oriented to Place Alcohol / Substance use:  Not Applicable Psych involvement (Current and /or in the community):  No (Comment)  Discharge Needs  Concerns to be addressed:  Lack of Support, Care Coordination Readmission within the last 30 days:  No Current discharge risk:  Lack of support system Barriers to Discharge:  Continued Medical Work up, [employer]   Ross Ludwig, [place] 02/03/2018, 1:04 PM

## 2018-02-03 NOTE — NC FL2 (Signed)
[place] LEVEL OF CARE SCREENING TOOL     IDENTIFICATION  Patient Name: Dana Austin Birthdate: 03-Mar-1925 Sex: female Admission Date (Current Location): 02/01/2018  [place] and [place] Number:  Engineering geologist and Address:  [facility], [address], [place], [place] [address]      Provider Number: [id number]  Attending Physician Name and Address:  Dustin Flock, MD  Relative Name and Phone Number:  Myers,Robert Relative [phone], or [facility] Daughter [phone]  [phone] or Myers,Nancy A Daughter [phone]  [phone]     Current Level of Care: Hospital Recommended Level of Care: [place] Prior Approval Number:    Date Approved/Denied:   PASRR Number: [id number]  Discharge Plan: SNF    Current Diagnoses: Patient Active Problem List   Diagnosis Date Noted  . Acute respiratory failure ([place]) 02/01/2018  . Tachycardia 04/06/2015  . Sepsis ([place]) 04/05/2015    Orientation RESPIRATION BLADDER Height & Weight     Self, Place  Normal Incontinent Weight: 113 lb (51.3 kg) Height:  5\' 5"  (165.1 cm)  BEHAVIORAL SYMPTOMS/MOOD NEUROLOGICAL BOWEL NUTRITION STATUS      Continent Diet(Cardiac diet)  AMBULATORY STATUS COMMUNICATION OF NEEDS Skin   Limited Assist Verbally Normal                       Personal Care Assistance Level of Assistance  Bathing, Feeding, Dressing Bathing Assistance: Limited assistance Feeding assistance: Limited assistance Dressing Assistance: Limited assistance     Functional Limitations Info  Speech, Hearing, Sight Sight Info: Adequate Hearing Info: Adequate Speech Info: Adequate    SPECIAL CARE FACTORS FREQUENCY  PT (By licensed PT), OT (By licensed OT)     PT Frequency: 5x a week OT Frequency: 5x a week            Contractures Contractures Info: Not present    Additional Factors Info  Code Status, Allergies, Psychotropic Code Status Info:  Full Code Allergies Info: MELOXICAM  Psychotropic Info: PARoxetine (PAXIL) tablet 10 mg          Current Medications (02/03/2018):  This is the current hospital active medication list Current Facility-Administered Medications  Medication Dose Route Frequency Provider Last Rate Last Dose  . 0.45 % sodium chloride infusion   Intravenous Continuous Tukov-Yual, Magdalene S, NP 10 mL/hr at 02/02/18 0800    . acetaminophen (TYLENOL) tablet 650 mg  650 mg Oral Q6H PRN Tukov-Yual, Magdalene S, NP       Or  . acetaminophen (TYLENOL) suppository 650 mg  650 mg Rectal Q6H PRN Tukov-Yual, Magdalene S, NP      . ALPRAZolam (XANAX) tablet 0.25 mg  0.25 mg Oral QHS PRN Tukov-Yual, Magdalene S, NP   0.25 mg at 02/03/18 0021  . amoxicillin-clavulanate (AUGMENTIN) 875-125 MG per tablet 1 tablet  1 tablet Oral Q12H Dustin Flock, MD   1 tablet at 02/03/18 1159  . [START ON 02/04/2018] aspirin chewable tablet 81 mg  81 mg Oral Daily Gollan, Kathlene November, MD      . atorvastatin (LIPITOR) tablet 40 mg  40 mg Oral q1800 Tukov-Yual, Magdalene S, NP   40 mg at 02/02/18 1759  . enalapril (VASOTEC) tablet 5 mg  5 mg Oral Daily Gollan, Kathlene November, MD      . famotidine (PEPCID) tablet 20 mg  20 mg Oral Daily Tukov-Yual, Magdalene S, NP   20 mg at 02/03/18 1159  . heparin ADULT infusion 100 units/mL (25000 units/286mL  sodium chloride 0.45%)  500 Units/hr Intravenous Continuous Tukov-Yual, Magdalene S, NP 5 mL/hr at 02/03/18 0422 500 Units/hr at 02/03/18 0422  . ipratropium-albuterol (DUONEB) 0.5-2.5 (3) MG/3ML nebulizer solution 3 mL  3 mL Nebulization TID Tukov-Yual, Magdalene S, NP   3 mL at 02/03/18 0723  . loratadine (CLARITIN) tablet 10 mg  10 mg Oral Daily Tukov-Yual, Magdalene S, NP   10 mg at 02/03/18 1159  . metoprolol succinate (TOPROL-XL) 24 hr tablet 25 mg  25 mg Oral Daily Tukov-Yual, Magdalene S, NP   25 mg at 02/03/18 1159  . mometasone-formoterol (DULERA) 200-5 MCG/ACT inhaler 2 puff  2 puff Inhalation BID  Tukov-Yual, Magdalene S, NP   2 puff at 02/03/18 1208  . multivitamin with minerals tablet 1 tablet  1 tablet Oral Daily Tukov-Yual, Magdalene S, NP   1 tablet at 02/03/18 1158  . nitroGLYCERIN (NITROSTAT) SL tablet 0.4 mg  0.4 mg Sublingual Q5 min PRN Tukov-Yual, Magdalene S, NP      . pantoprazole (PROTONIX) EC tablet 40 mg  40 mg Oral Daily Tukov-Yual, Magdalene S, NP   40 mg at 02/03/18 1158  . PARoxetine (PAXIL) tablet 10 mg  10 mg Oral Daily Tukov-Yual, Magdalene S, NP   10 mg at 02/03/18 1159  . predniSONE (DELTASONE) tablet 2.5 mg  2.5 mg Oral Q breakfast Tukov-Yual, Magdalene S, NP   2.5 mg at 02/03/18 1159     Discharge Medications: Please see discharge summary for a list of discharge medications.  Relevant Imaging Results:  Relevant Lab Results:   Additional Information SSN [id number]  Ross Ludwig, [place]

## 2018-02-03 NOTE — Progress Notes (Signed)
Advanced care plan.  Purpose of the Encounter: CODE STATUS  Parties in Attendance: Patient's son and daughter-in-law  Patient's Decision Capacity: Not intact  Subjective/Patient's story:  Patient is a 84 year old admitted with acute respiratory failure non-ST MI now has acute delirium and has gotten worse  Objective/Medical story I discussed with the patient and daughter-in-law again regarding CODE STATUS strongly recommend to make patient DNR   Goals of care determination: Remains full code, will place palliative care consult    CODE STATUS: Remains full code   Time spent discussing advanced care planning: 16 minutes

## 2018-02-03 NOTE — Progress Notes (Addendum)
Pt is anxious and wanting to get up. Pt was re-directed but pt still was non compliant. Xanax 0.25 mg was given at 0021 with ice cream, but after 30 mins pt still wants to get up. Chanrge nurse was notified. CN informed me that the supervisor was notified and trying to find if we can get a sitter. Waiting for callback. Will continue to monitor.  Update 0110: CN came and informed that the supervisor found someone to sit with the pt. Page prime. Will continue to monitor.  Update 0125: Talked to docotr Duane Boston and ordered a bedside sitter and 25 mg Seroquel once. Will continue to monitor.

## 2018-02-03 NOTE — Progress Notes (Signed)
Progress Note  Patient Name: Dana Austin Date of Encounter: 02/03/2018  Primary Cardiologist: new to [facility]  Subjective    Agitated overnight, sundowning Given sedation medication, sleeping this morning Has not been ambulating Good appetite yesterday medical power of attorney currently in [place] for at least another week daughter-in-law at the bedside Family continues to talk about her CODE STATUS but no changes  Long discussion with family concerning echocardiogram findings  ejection fraction less than 20% global hypokinesis concerning for ischemic cardiomyopathy and severe underlying coronary disease  Blood pressure heart rate stable  Inpatient Medications    Scheduled Meds: . amoxicillin-clavulanate  1 tablet Oral Q12H  . [START ON 02/04/2018] aspirin  81 mg Oral Daily  . atorvastatin  40 mg Oral q1800  . enalapril  5 mg Oral Daily  . famotidine  20 mg Oral Daily  . ipratropium-albuterol  3 mL Nebulization TID  . loratadine  10 mg Oral Daily  . metoprolol succinate  25 mg Oral Daily  . mometasone-formoterol  2 puff Inhalation BID  . multivitamin with minerals  1 tablet Oral Daily  . pantoprazole  40 mg Oral Daily  . PARoxetine  10 mg Oral Daily  . predniSONE  2.5 mg Oral Q breakfast   Continuous Infusions: . sodium chloride 10 mL/hr at 02/02/18 0800  . heparin 500 Units/hr (02/03/18 0422)   PRN Meds: acetaminophen **OR** acetaminophen, ALPRAZolam, nitroGLYCERIN   Vital Signs    Vitals:   02/03/18 0644 02/03/18 0721 02/03/18 0723 02/03/18 1334  BP: 132/78 123/71    Pulse: 87 88 84   Resp:  18 16   Temp: (!) 97.3 F (36.3 C) 98.1 F (36.7 C)    TempSrc: Oral Oral    SpO2: 96% 95% 93% 98%  Weight: 51.3 kg     Height:        Intake/Output Summary (Last 24 hours) at 02/03/2018 1459 Last data filed at 02/03/2018 0801 Gross per 24 hour  Intake 480.35 ml  Output 0 ml  Net 480.35 ml   Filed Weights   02/01/18 0718 02/01/18 1030 02/03/18 0644  Weight:  43.1 kg 42.7 kg 51.3 kg    Telemetry    normal sinus rhythm - Personally Reviewed  ECG      Physical Exam   GEN:  Frail, thin, sleeping Neck: No JVD Cardiac: RRR, no murmurs, rubs, or gallops.  Respiratory: Clear to auscultation bilaterally. GI: Soft, nontender, non-distended  MS: No edema; No deformity. Neuro:  unable to test Psych: sleeping this morning  Labs    Chemistry Recent Labs  Lab 02/01/18 0722 02/01/18 2024 02/02/18 0243  NA 140 137 139  K 5.2* 4.8 4.0  CL 104 101 103  CO2 26 29 28   GLUCOSE 208* 201* 144*  BUN 25* 25* 27*  CREATININE 0.82 1.01* 0.75  CALCIUM 8.9 8.9 8.7*  GFRNONAA >60 47* >60  GFRAA >60 55* >60  ANIONGAP 10 7 8      Hematology Recent Labs  Lab 02/01/18 0722 02/02/18 0620 02/03/18 0655  WBC 10.0 13.7* 11.0  RBC 4.97 4.06 4.07  HGB 14.7 12.1 12.3  HCT 45.3 36.0 35.9  MCV 91.1 88.7 88.3  MCH 29.6 29.9 30.1  MCHC 32.5 33.7 34.1  RDW 16.0* 15.1* 15.2*  PLT 285 255 267    Cardiac Enzymes Recent Labs  Lab 02/01/18 0722 02/01/18 2024 02/02/18 0243  TROPONINI 0.32* 2.64* 1.81*   No results for input(s): TROPIPOC in the last 168 hours.  BNP Recent Labs  Lab 02/01/18 0722  BNP 1,990.0*     DDimer No results for input(s): DDIMER in the last 168 hours.   Radiology    No results found.  Cardiac Studies   Echocardiogram - Left ventricle: The cavity size was severely dilated. Systolic   function was severely reduced. The estimated ejection fraction   was less than 20% Severe global akinesis with basal wall motion   best preserved. LV appears dilated and globular, possible regions   of aneurysm. The study is not technically sufficient to allow   evaluation of LV diastolic function. - Aortic valve: There was mild regurgitation. - Left atrium: The atrium was normal in size. - Right ventricle: Systolic function was normal. - Pulmonary arteries: Systolic pressure was within the normal   range.  Patient Profile      Dana Austin is a 84 yo old woman with history of COPD on chronic prednisone, stroke, paroxysmal atrial fibrillation, prior falls with subdural hematoma, presented to the hospital with respiratory distress, markedly abnormal EKG concerning for ischemia, cardiac catheterization declined by interventional team given her frail state  Assessment & Plan    A/P: STEMI  Anterolateral leads Not taken to the cardiac catheterization lab given age, frail, felt to be high risk Troponin peak of 2.64 trending down Echocardiogram with ejection fraction less than 20% Likely has severe multivessel coronary disease Unable to definitively exclude stress cardiomyopathy  normal ejection fraction 2016 -we have recommended conservative therapy aspirin, beta blocker, statin, started on enalapril -----would consider palliative consultation/hospice Given severe cardiac disease Daughter who makes decisions is currently in [place]  Acute respiratory distress Dilated ischemic cardiomyopathy BNP 2000 Improved symptoms on Lasix Would recommend Lasix  2 or 3 times per week Currently not drinking very much  COPD On chronic prednisone Chest x-ray with bilateral effusions that have increased, interstitial edema 2 doses of Lasix have been given with improved symptoms Lasix as needed for ankle swelling abdominal bloating or shortness of breath  By long discussion with family at the bedside She is relatively debilitated now with severe cardiac disease Outlook is poor High risk of sudden death given ejection fraction less than 20% Long discussion concerning code   Total encounter time more than 35 minutes  Greater than 50% was spent in counseling and coordination of care with the patient  For questions or updates, please contact [facility] Please consult [email or web address] for contact info under Cardiology/STEMI.      Signed, Ida Rogue, MD  02/03/2018, 2:59 PM

## 2018-02-03 NOTE — Clinical Social Work Note (Signed)
CSW received consult for patient needing short term rehab.  CSW met with patient and her daughter in law, and they would like CSW to begin bed search in [facility] for SNF placement.  CSW also contacted patient's insurance company to begin insurance authorization.  CSW to continue to follow patient's progress throughout discharge planning.  Jones Broom. [place], MSW, [place]  02/03/2018 1:52 PM

## 2018-02-03 NOTE — Progress Notes (Signed)
ANTICOAGULATION CONSULT NOTE - Initial Consult  Pharmacy Consult for heparin Indication: chest pain/ACS  Allergies  Allergen Reactions  . Meloxicam Shortness Of Breath    Pt is not aware she is allergic to this medication. She "becomes short of breath all the time"     Patient Measurements: Height: 5\' 5"  (165.1 cm) Weight: 113 lb (51.3 kg) IBW/kg (Calculated) : 57 Heparin Dosing Weight: 43.1 kg  Vital Signs: Temp: 98.1 F (36.7 C) (09/01 0721) Temp Source: Oral (09/01 0721) BP: 123/71 (09/01 0721) Pulse Rate: 84 (09/01 0723)  Labs: Recent Labs    02/01/18 6384 02/01/18 5364 02/01/18 2024 02/02/18 0243 02/02/18 0620 02/03/18 0655  HGB 14.7  --   --   --  12.1 12.3  HCT 45.3  --   --   --  36.0 35.9  PLT 285  --   --   --  255 267  APTT  --  24  --   --   --   --   LABPROT  --  12.1  --   --   --   --   INR  --  0.90  --   --   --   --   HEPARINUNFRC  --   --  0.44  --  0.36 0.38  CREATININE 0.82  --  1.01* 0.75  --   --   TROPONINI 0.32*  --  2.64* 1.81*  --   --     Estimated Creatinine Clearance: 37.1 mL/min (by C-G formula based on SCr of 0.75 mg/dL).  Assessment: 84 yo female with history of CVA and subdural hematoma presents to ED for respiratory distress.  Will medically manage at present with heparin and aspirin.  8/30 Heparin infusing at 500 units/hr, first Heparin level at 20:24  resulted at 0.44  Goal of Therapy:  Heparin level 0.3-0.7 units/ml Monitor platelets by anticoagulation protocol: Yes   Plan:  09:01 06:55 Heparin level 0.38. Level now therapeutic x2. Will continue with current rate of 500 units/hr.  Will continue to check HL and CBC with AM labs daily.  Laural Benes, PharmD, BCPS Clinical Pharmacist 02/03/2018 7:40 AM

## 2018-02-04 DIAGNOSIS — Z7189 Other specified counseling: Secondary | ICD-10-CM

## 2018-02-04 DIAGNOSIS — Z515 Encounter for palliative care: Secondary | ICD-10-CM

## 2018-02-04 LAB — CBC
HCT: 35.2 % (ref 35.0–47.0)
HEMOGLOBIN: 12.1 g/dL (ref 12.0–16.0)
MCH: 30.7 pg (ref 26.0–34.0)
MCHC: 34.4 g/dL (ref 32.0–36.0)
MCV: 89.2 fL (ref 80.0–100.0)
PLATELETS: 236 10*3/uL (ref 150–440)
RBC: 3.95 MIL/uL (ref 3.80–5.20)
RDW: 15.2 % — ABNORMAL HIGH (ref 11.5–14.5)
WBC: 9 10*3/uL (ref 3.6–11.0)

## 2018-02-04 LAB — HEPARIN LEVEL (UNFRACTIONATED): Heparin Unfractionated: 0.27 IU/mL — ABNORMAL LOW (ref 0.30–0.70)

## 2018-02-04 MED ORDER — CLOPIDOGREL BISULFATE 75 MG PO TABS
75.0000 mg | ORAL_TABLET | Freq: Every day | ORAL | Status: DC
  Administered 2018-02-04 – 2018-02-05 (×2): 75 mg via ORAL
  Filled 2018-02-04 (×2): qty 1

## 2018-02-04 MED ORDER — METOPROLOL SUCCINATE ER 25 MG PO TB24
12.5000 mg | ORAL_TABLET | Freq: Every day | ORAL | Status: DC
  Administered 2018-02-05: 12.5 mg via ORAL
  Filled 2018-02-04: qty 1

## 2018-02-04 MED ORDER — MIDODRINE HCL 5 MG PO TABS
5.0000 mg | ORAL_TABLET | Freq: Three times a day (TID) | ORAL | Status: DC
  Administered 2018-02-04 – 2018-02-05 (×4): 5 mg via ORAL
  Filled 2018-02-04 (×4): qty 1

## 2018-02-04 NOTE — Progress Notes (Signed)
Clinical Education officer, museum (CSW) presented bed offers to patient and her son and daughter in [employer] [place]. Per son he will tour the facilities this morning and call CSW back with a SNF choice. CSW asked PT to see patient today for insurance authorization. CSW contacted Health [facility] and started SNF authorization. CSW will continue to follow and assist as needed.   [employer], LCSW [phone]

## 2018-02-04 NOTE — Progress Notes (Signed)
Progress Note  Patient Name: Irja Wheless Date of Encounter: 02/04/2018  Primary Cardiologist: new to [facility]  Subjective   Good night sleep, no sedation needed Alert this morning, ate breakfast Code changed to DNR/DNI per the family/son at the bedside Discussed with Dr. Posey Pronto, this has been my report placed in the computer Blood pressure low this morning in the 40X systolic Beta-blocker held, enalapril held   Inpatient Medications    Scheduled Meds: . amoxicillin-clavulanate  1 tablet Oral Q12H  . aspirin  81 mg Oral Daily  . atorvastatin  40 mg Oral q1800  . clopidogrel  75 mg Oral Daily  . famotidine  20 mg Oral Daily  . ipratropium-albuterol  3 mL Nebulization TID  . loratadine  10 mg Oral Daily  . [START ON 02/05/2018] metoprolol succinate  12.5 mg Oral Daily  . midodrine  5 mg Oral TID WC  . mometasone-formoterol  2 puff Inhalation BID  . multivitamin with minerals  1 tablet Oral Daily  . pantoprazole  40 mg Oral Daily  . PARoxetine  10 mg Oral Daily  . predniSONE  2.5 mg Oral Q breakfast   Continuous Infusions: . sodium chloride 10 mL/hr at 02/02/18 0800   PRN Meds: acetaminophen **OR** acetaminophen, ALPRAZolam, nitroGLYCERIN   Vital Signs    Vitals:   02/04/18 1006 02/04/18 1036 02/04/18 1131 02/04/18 1154  BP: (!) 88/52 (!) 88/53 (!) 98/57 104/60  Pulse: 84 78 79 79  Resp:      Temp:      TempSrc:      SpO2: 96% 96%    Weight:      Height:        Intake/Output Summary (Last 24 hours) at 02/04/2018 1238 Last data filed at 02/04/2018 0423 Gross per 24 hour  Intake 305.48 ml  Output 0 ml  Net 305.48 ml   Filed Weights   02/01/18 1030 02/03/18 0644 02/04/18 0404  Weight: 42.7 kg 51.3 kg 49.4 kg    Telemetry    normal sinus rhythm - Personally Reviewed  ECG      Physical Exam   Constitutional:  Thin, frail, communicative HENT:  Head: Normocephalic and atraumatic.  Eyes:  no discharge. No scleral icterus.  Neck: Normal range of  motion. Neck supple. No JVD present.  Cardiovascular: Normal rate, regular rhythm, normal heart sounds and intact distal pulses. Exam reveals no gallop and no friction rub. No edema No murmur heard. Pulmonary/Chest: Effort normal and breath sounds normal. No stridor. No respiratory distress.  no wheezes.  no rales.  no tenderness.  Abdominal: Soft.  no distension.  no tenderness.  Musculoskeletal: Normal range of motion.  no  tenderness or deformity.  Neurological:  normal muscle tone. Coordination normal. No atrophy Skin: Skin is warm and dry. No rash noted. not diaphoretic.  Psychiatric: Mild confusion   Labs    Chemistry Recent Labs  Lab 02/01/18 0722 02/01/18 2024 02/02/18 0243  NA 140 137 139  K 5.2* 4.8 4.0  CL 104 101 103  CO2 26 29 28   GLUCOSE 208* 201* 144*  BUN 25* 25* 27*  CREATININE 0.82 1.01* 0.75  CALCIUM 8.9 8.9 8.7*  GFRNONAA >60 47* >60  GFRAA >60 55* >60  ANIONGAP 10 7 8      Hematology Recent Labs  Lab 02/02/18 0620 02/03/18 0655 02/04/18 0345  WBC 13.7* 11.0 9.0  RBC 4.06 4.07 3.95  HGB 12.1 12.3 12.1  HCT 36.0 35.9 35.2  MCV 88.7 88.3  89.2  MCH 29.9 30.1 30.7  MCHC 33.7 34.1 34.4  RDW 15.1* 15.2* 15.2*  PLT 255 267 236    Cardiac Enzymes Recent Labs  Lab 02/01/18 0722 02/01/18 2024 02/02/18 0243  TROPONINI 0.32* 2.64* 1.81*   No results for input(s): TROPIPOC in the last 168 hours.   BNP Recent Labs  Lab 02/01/18 0722  BNP 1,990.0*     DDimer No results for input(s): DDIMER in the last 168 hours.   Radiology    No results found.  Cardiac Studies   Echocardiogram - Left ventricle: The cavity size was severely dilated. Systolic   function was severely reduced. The estimated ejection fraction   was less than 20% Severe global akinesis with basal wall motion   best preserved. LV appears dilated and globular, possible regions   of aneurysm. The study is not technically sufficient to allow   evaluation of LV diastolic  function. - Aortic valve: There was mild regurgitation. - Left atrium: The atrium was normal in size. - Right ventricle: Systolic function was normal. - Pulmonary arteries: Systolic pressure was within the normal   range.  Patient Profile     Ms. Swords is a 84 yo old woman with history of COPD on chronic prednisone, stroke, paroxysmal atrial fibrillation, prior falls with subdural hematoma, presented to the hospital with respiratory distress, markedly abnormal EKG concerning for ischemia, cardiac catheterization declined by interventional team given her frail state  Assessment & Plan    A/P: STEMI  Anterolateral leads Not taken to the cardiac catheterization lab given age, frail, felt to be high risk Troponin peak of 2.64 trending down Echocardiogram with ejection fraction less than 20% Suspected severe multivessel coronary disease Unable to definitively exclude stress cardiomyopathy  normal ejection fraction 2016 recommended conservative therapy aspirin, statin Low blood pressure on beta-blocker and ACE inhibitor Could retry metoprolol succinate 12.5 mg daily once blood pressure improves Start ACE inhibitor at a later date --Low blood pressure could be secondary to poor fluid intake yesterday as she was sedated from sleeping pill given 2 nights ago Would recommend palliative consultation/hospice Given severe cardiac disease She is now DNR/DNI  Acute respiratory distress Dilated ischemic cardiomyopathy BNP 2000 Improved symptoms on Lasix Would recommend Lasix  2 or 3 times per week  COPD On chronic prednisone Chest x-ray with bilateral effusions that have increased, interstitial edema  Lasix  given with improved symptoms Lasix as needed for ankle swelling abdominal bloating or shortness of breath  long discussion with family at the bedside debilitated now with severe cardiac disease Outlook is poor High risk of sudden death given ejection fraction less than 20% No  DNR/DNI Recommend palliative care consult.  Requiring high level of care/SNF   Total encounter time more than 35 minutes  Greater than 50% was spent in counseling and coordination of care with the patient  For questions or updates, please contact [place] Please consult [email or web address] for contact info under Cardiology/STEMI.      Signed, Ida Rogue, MD  02/04/2018, 12:38 PM

## 2018-02-04 NOTE — Consult Note (Signed)
Consultation Note Date: 02/04/2018   Patient Name: Dana Austin  DOB: 1925-02-19  MRN: [id number]  Age / Sex: 84 y.o., female  PCP: Tracie Harrier, MD Referring Physician: Dustin Flock, MD  Reason for Consultation: Establishing goals of care  HPI/Patient Profile: 84 years old lady with history of COPD steroid-dependent, CVA, paroxysmal atrial fibrillation.  Per EMR note, patient presented to the ED with progressively worsening shortness of breath for the past several days, initial O2 sats 60% on room air, and was placed on BIPAP.  Initial EKG was remarkable for anterolateral ST elevation MI, code STEMI was activated, after cardiology evaluation the patient was found not to be a good candidate for cardiac cath considering her age and frail state.   Clinical Assessment and Goals of Care: [name] is sitting in a bedside chair in good spirits; she has just worked with OT.  Her son and daughter in law are at bedside. Both daughters including the daughter who is POA are in [place].    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 She lives with her daughter, and has her own bedroom and bathroom. She performs her ADL's. She uses a walker at times, but wall walks if able. She has difficulty with finding the right words since her CVA. Son states although she is a petite woman, she has a good appetite  We discussed her diagnosis, prognosis, GOC, EOL wishes disposition and options. Son states her cardiac diagnosis caught the family by surprise. He states she had a cardiac workup in 2016, and everything was okay. Over the past 2-3 weeks, she has had increased SOB, but the family felt it was secondary to worsening COPD. She was using her inhaler more frequently, but the family thought it was because she was unable to hold her breath following the inhalation on the inhaler.   Son states the difference between an aggressive  medical intervention path and a hospice comfort care path has been discussed with them as well as her health status and frailty. He states the plan will be to transfer her to [facility] for rehab with palliative medicine to follow. If she improves, palliative will follow after she is discharged from the facility. If she declines in rehab or after, the plan will be to transition to hospice care with a focus on comfort, with a goal not to transition her back to the hospital. DNR status confirmed.    Daughter who is POA is in [place].   SUMMARY OF RECOMMENDATIONS   Plan for D/C to SNF with palliative and transition to hospice if decline.   Code Status/Advance Care Planning:  DNR    Symptom Management:   Per primary team.  Palliative Prophylaxis:   Delirium Protocol    Prognosis:   Poor. STEMI with conservative management due to age and frailty. COPD. CHF EF < 20%.   Discharge Planning: [facility] for rehab with Palliative care service follow-up      Primary Diagnoses: Present on Admission: . Acute respiratory failure ([place])  I have reviewed the medical record, interviewed the patient and family, and examined the patient. The following aspects are pertinent.  Past Medical History:  Diagnosis Date  . Anxiety   . Asthma   . COPD (chronic obstructive pulmonary disease) ([place])    a. Chronic prednisone.  . Depression   . Essential hypertension   . GERD (gastroesophageal reflux disease)   . History of cardiac cath    a. 10/2009 Cath: reportedly nl.  . History of echocardiogram    a. 11/2014 Echo: Nl LVEF/RVEF. Triv AI/TR ([facility]).  . History of ischemic left MCA stroke    a. 08/2014 CVA in [place], [place] - treated w/ TPA. Residual R hemiparesis and aphasia.  . Hyperlipidemia   . Insomnia   . Lower extremity cellulitis    a. Dx 01/24/2018 - outpt Keflex.  . Onychomycosis   . PAF (paroxysmal atrial fibrillation) ([place])   . Skin cancer   . Subdural hematoma  ([place])    a. 11/2014 in setting of syncope/fall.  . Syncope    a. 11/2014 syncope w/ fall and resultant T4 compression fx and SDH. Felt to be 2/2 overmedication and dehydration.  . Vitamin D deficiency    Social History   Socioeconomic History  . Marital status: Widowed    Spouse name: Not on file  . Number of children: Not on file  . Years of education: Not on file  . Highest education level: Not on file  Occupational History  . Not on file  Social Needs  . Financial resource strain: Not on file  . Food insecurity:    Worry: Not on file    Inability: Not on file  . Transportation needs:    Medical: Not on file    Non-medical: Not on file  Tobacco Use  . Smoking status: Never Smoker  . Smokeless tobacco: Never Used  Substance and Sexual Activity  . Alcohol use: No    Comment: occasional  . Drug use: No  . Sexual activity: Not on file  Lifestyle  . Physical activity:    Days per week: Not on file    Minutes per session: Not on file  . Stress: Not on file  Relationships  . Social connections:    Talks on phone: Not on file    Gets together: Not on file    Attends religious service: Not on file    Active member of club or organization: Not on file    Attends meetings of clubs or organizations: Not on file    Relationship status: Not on file  Other Topics Concern  . Not on file  Social History Narrative  . Not on file   Family History  Problem Relation Age of Onset  . CAD Brother   . Diabetes Mellitus II Mother    Scheduled Meds: . aspirin  81 mg Oral Daily  . atorvastatin  40 mg Oral q1800  . clopidogrel  75 mg Oral Daily  . famotidine  20 mg Oral Daily  . ipratropium-albuterol  3 mL Nebulization TID  . loratadine  10 mg Oral Daily  . [START ON 02/05/2018] metoprolol succinate  12.5 mg Oral Daily  . midodrine  5 mg Oral TID WC  . mometasone-formoterol  2 puff Inhalation BID  . multivitamin with minerals  1 tablet Oral Daily  . pantoprazole  40 mg Oral Daily    . PARoxetine  10 mg Oral Daily  . predniSONE  2.5 mg Oral Q breakfast  Continuous Infusions: . sodium chloride 10 mL/hr at 02/02/18 0800   PRN Meds:.acetaminophen **OR** acetaminophen, ALPRAZolam, nitroGLYCERIN Medications Prior to Admission:  Prior to Admission medications   Medication Sig Start Date End Date Taking? Authorizing Provider  acetaminophen (TYLENOL) 325 MG tablet Take 975 mg by mouth 3 (three) times daily.   Yes [provider]  ALPRAZolam (XANAX) 0.25 MG tablet Take 0.25 mg by mouth at bedtime as needed for anxiety.   Yes [provider]  atorvastatin (LIPITOR) 40 MG tablet Take 40 mg by mouth daily.  03/01/15  Yes [provider]  budesonide-formoterol (SYMBICORT) 160-4.5 MCG/ACT inhaler Inhale 2 puffs into the lungs 2 (two) times daily.   Yes [provider]  loratadine (CLARITIN) 10 MG tablet Take 10 mg by mouth daily.   Yes [provider]  metoprolol succinate (TOPROL-XL) 25 MG 24 hr tablet Take 25 mg by mouth daily.   Yes [provider]  Multiple Vitamin (MULTIVITAMIN WITH MINERALS) TABS tablet Take 1 tablet by mouth daily.   Yes [provider]  omeprazole (PRILOSEC) 20 MG capsule Take 20 mg by mouth daily.   Yes [provider]  PARoxetine (PAXIL) 10 MG tablet Take 10 mg by mouth daily.  10/25/17  Yes [provider]  predniSONE (DELTASONE) 2.5 MG tablet Take 2.5 mg by mouth daily with breakfast.   Yes [provider]   Allergies  Allergen Reactions  . Meloxicam Shortness Of Breath    Pt is not aware she is allergic to this medication. She "becomes short of breath all the time"    Review of Systems  Constitutional: Positive for fatigue.    Physical Exam  Constitutional: No distress.  Pulmonary/Chest: Effort normal.  Neurological: She is alert.    Vital Signs: BP 104/60   Pulse 79   Temp 98.3 F (36.8 C) (Oral)   Resp 20   Ht 5\' 5"  (1.651 m)   Wt 49.4 kg   SpO2  96%   BMI 18.14 kg/m  Pain Scale: 0-10 POSS *See Group Information*: 1-Acceptable,Awake and alert Pain Score: 0-No pain   SpO2: SpO2: 96 % O2 Device:SpO2: 96 % O2 Flow Rate: .O2 Flow Rate (L/min): 1 L/min  IO: Intake/output summary:   Intake/Output Summary (Last 24 hours) at 02/04/2018 1457 Last data filed at 02/04/2018 1007 Gross per 24 hour  Intake 339 ml  Output 0 ml  Net 339 ml    LBM: Last BM Date: 02/01/18 Baseline Weight: Weight: 43.1 kg Most recent weight: Weight: 49.4 kg     Palliative Assessment/Data:      Time In: 2:30 Time Out: 3:05 Time Total: 35 min Greater than 50%  of this time was spent counseling and coordinating care related to the above assessment and plan.  Signed by: Asencion Gowda, NP   Please contact Palliative Medicine Team phone at [phone] for questions and concerns.  For individual provider: See Shea Evans

## 2018-02-04 NOTE — Progress Notes (Signed)
Bleeding from the IV site on right forearm.  Site bleeds when the catheter moves.  Catheter and tubing secured in hopes that it will not move in and out.  2 x 2 Gauze folded into quarters pressure dressing applied to the site without obstructing the flow.  Will monitor.

## 2018-02-04 NOTE — Plan of Care (Signed)
  Problem: Clinical Measurements: Goal: Diagnostic test results will improve Outcome: Progressing   Problem: Pain Managment: Goal: General experience of comfort will improve Outcome: Progressing   

## 2018-02-04 NOTE — Clinical Social Work Placement (Signed)
   CLINICAL SOCIAL WORK PLACEMENT  NOTE  Date:  02/04/2018  Patient Details  Name: Dana Austin MRN: [id number] Date of Birth: 1924/07/11  Clinical Social Work is seeking post-discharge placement for this patient at the [place] level of care (*CSW will initial, date and re-position this form in  chart as items are completed):  Yes   Patient/family provided with [facility] Work Department's list of facilities offering this level of care within the geographic area requested by the patient (or if unable, by the patient's family).  Yes   Patient/family informed of their freedom to choose among providers that offer the needed level of care, that participate in Medicare, Medicaid or managed care program needed by the patient, have an available bed and are willing to accept the patient.  Yes   Patient/family informed of [facility]'s ownership interest in [facility] and [facility], as well as of the fact that they are under no obligation to receive care at these facilities.  PASRR submitted to EDS on 02/03/18     PASRR number received on 02/03/18     Existing PASRR number confirmed on       FL2 transmitted to all facilities in geographic area requested by pt/family on 02/03/18     FL2 transmitted to all facilities within larger geographic area on       Patient informed that his/her managed care company has contracts with or will negotiate with certain facilities, including the following:        Yes   Patient/family informed of bed offers received.  Patient chooses bed at       Physician recommends and patient chooses bed at      Patient to be transferred to   on  .  Patient to be transferred to facility by       Patient family notified on   of transfer.  Name of family member notified:        PHYSICIAN Please sign FL2     Additional Comment:    _______________________________________________ Ernst Cumpston, Veronia Beets, LCSW 02/04/2018,  10:09 AM

## 2018-02-04 NOTE — Progress Notes (Signed)
Physical Therapy Treatment Patient Details Name: Dana Austin MRN: [id number] DOB: 1924-10-11 Today's Date: 02/04/2018    History of Present Illness Pt is a 84 y.o. female presenting to hospital 02/01/18 with respiratory distress (sats 60% room air) and initially placed on Bipap.  Recent treatment R LE cellulitis.  Pt admitted with acute anterolateral STEMI (being medically managed) and acute hypoxic respiratory failure d/t acute CHF.  PMH includes h/o L MCA CVA, SDH, and tachycardia.    PT Comments    Pt making good progress with therapy on this date. She is able to perform supine exercises with therapist. She requires minA+1 for bed mobility. Pt requires minA+1 to come to standing with rolling walker. Attempted standing marches however pt is unwilling to put weight on RLE with standing marches. Peformed standing pivot transfer from bed to recliner with modA+1. Pt is unable to ambulate at this time. Pt will benefit from PT services to address deficits in strength, balance, and mobility in order to return to full function at home.    Follow Up Recommendations  SNF     Equipment Recommendations  Rolling walker with 5" wheels    Recommendations for Other Services       Precautions / Restrictions Precautions Precautions: Fall Restrictions Weight Bearing Restrictions: No    Mobility  Bed Mobility Overal bed mobility: Needs Assistance Bed Mobility: Supine to Sit     Supine to sit: Min assist;HOB elevated Sit to supine: Min assist   General bed mobility comments: Pt requires assist for LE management. Requires extra time to come to EOB and cues for positioning prior to transfers   Transfers Overall transfer level: Needs assistance Equipment used: Rolling walker (2 wheeled) Transfers: Sit to/from Stand Sit to Stand: Mod assist         General transfer comment: Pt requires minA+1 to come to standing with rolling walker. Attempted standing marches however pt is unwilling to  put weight on RLE with standing marches. Peformed standing pivot transfer from bed to recliner with modA+1. Pt is unable to ambulate at this time  Ambulation/Gait                 Stairs             Wheelchair Mobility    Modified Rankin (Stroke Patients Only)       Balance Overall balance assessment: Needs assistance Sitting-balance support: Bilateral upper extremity supported;Feet supported Sitting balance-Leahy Scale: Fair Sitting balance - Comments: requires BLE support for static sitting balance   Standing balance support: Bilateral upper extremity supported Standing balance-Leahy Scale: Poor Standing balance comment: initial posterior lean standing requiring min to mod assist to shift weight forward with cues for use of RW                            Cognition Arousal/Alertness: Awake/alert Behavior During Therapy: WFL for tasks assessed/performed Overall Cognitive Status: Within Functional Limits for tasks assessed                                        Exercises General Exercises - Lower Extremity Ankle Circles/Pumps: Both;10 reps Quad Sets: Both;10 reps Gluteal Sets: Both;10 reps Heel Slides: Both;10 reps Hip ABduction/ADduction: Both;10 reps Straight Leg Raises: Both;10 reps Other Exercises Other Exercises: functional transfer training and balance training to improve independence for toilet transfers in  future session    General Comments General comments (skin integrity, edema, etc.): Pt BP in supine at start of session 98/57, sitting EOB 104/60, standing 3 minutes 80/63, long sitting in bed 100/61. HR 84-90, O2 sats on RA >95%.      Pertinent Vitals/Pain Pain Assessment: Faces Faces Pain Scale: Hurts a little bit Pain Location: B heels  Pain Descriptors / Indicators: Discomfort Pain Intervention(s): Monitored during session    Home Living Family/patient expects to be discharged to:: Private residence Living  Arrangements: Children Available Help at Discharge: Family;Available 24 hours/day Type of Home: House Home Access: Stairs to enter Entrance Stairs-Rails: None Home Layout: Two level;Able to live on main level with bedroom/bathroom Home Equipment: Kasandra Knudsen - single point;Walker - 4 wheels Additional Comments: Pt stays with 3 different children (in 3 different states) for 5 months at a time.  Currently staying with daughter in [place] although she is on vacation (returning on September 8th).  Son and daughter in law from [place] currently staying with pt at daughter's [place] home.    Prior Function Level of Independence: Needs assistance  Gait / Transfers Assistance Needed: Ambulates holding onto furniture during the day (sometimes uses 4ww at night); rarely uses SPC; uses 4ww in community but tends to only do shorter distances. ADL's / Homemaking Assistance Needed: Pt endorses needing some assist for socks/shoes occasionally, but has generally been fairly independent with ADL prior to admission, per pt report (family did not dispute) Comments: Reports no falls in past 6 months.   PT Goals (current goals can now be found in the care plan section) Acute Rehab PT Goals Patient Stated Goal: to be able to walk again PT Goal Formulation: With patient Time For Goal Achievement: 02/16/18 Potential to Achieve Goals: Good Progress towards PT goals: Progressing toward goals    Frequency    Min 2X/week      PT Plan Current plan remains appropriate    Co-evaluation              AM-PAC PT "6 Clicks" Daily Activity  Outcome Measure  Difficulty turning over in bed (including adjusting bedclothes, sheets and blankets)?: A Little Difficulty moving from lying on back to sitting on the side of the bed? : Unable Difficulty sitting down on and standing up from a chair with arms (e.g., wheelchair, bedside commode, etc,.)?: A Lot Help needed moving to and from a bed to chair (including a wheelchair)?: A  Lot Help needed walking in hospital room?: Total Help needed climbing 3-5 steps with a railing? : Total 6 Click Score: 10    End of Session Equipment Utilized During Treatment: Gait belt Activity Tolerance: Patient tolerated treatment well Patient left: with call bell/phone within reach;in chair;with chair alarm set;with family/visitor present Nurse Communication: Mobility status;Precautions PT Visit Diagnosis: Other abnormalities of gait and mobility (R26.89);Muscle weakness (generalized) (M62.81);Difficulty in walking, not elsewhere classified (R26.2)     Time: 1696-7893 PT Time Calculation (min) (ACUTE ONLY): 15 min  Charges:  $Therapeutic Exercise: 8-22 mins                     Lyndel Safe Jenisis Harmsen PT, DPT, GCS    Cong Hightower 02/04/2018, 2:57 PM

## 2018-02-04 NOTE — Progress Notes (Addendum)
[place] at [facility]                                                                                                                                                                                  Patient Demographics   Dana Austin, is a 84 y.o. female, DOB - [birth year], KNL:[id number]  Admit date - 02/01/2018   Admitting Physician Dustin Flock, MD  Outpatient Primary MD for the patient is Tracie Harrier, MD   LOS - 3  Subjective: Patient more awake but is confused but much better compared to yesterday blood pressure was lower earlier   Review of Systems:   CONSTITUTIONAL: Currently sleepy unable to arouse Vitals:   Vitals:   02/04/18 1006 02/04/18 1036 02/04/18 1131 02/04/18 1154  BP: (!) 88/52 (!) 88/53 (!) 98/57 104/60  Pulse: 84 78 79 79  Resp:      Temp:      TempSrc:      SpO2: 96% 96%    Weight:      Height:        Wt Readings from Last 3 Encounters:  02/04/18 49.4 kg  06/16/15 45.4 kg  04/06/15 46.9 kg     Intake/Output Summary (Last 24 hours) at 02/04/2018 1337 Last data filed at 02/04/2018 0423 Gross per 24 hour  Intake 305.48 ml  Output 0 ml  Net 305.48 ml    Physical Exam:   GENERAL: More awake HEAD, EYES, EARS, NOSE AND THROAT: Atraumatic, normocephalic. Extraocular muscles are intact. Pupils equal and reactive to light. Sclerae anicteric. No conjunctival injection. No oro-pharyngeal erythema.  NECK: Supple. There is no jugular venous distention. No bruits, no lymphadenopathy, no thyromegaly.  HEART: Regular rate and rhythm,. No murmurs, no rubs, no clicks.  LUNGS: Clear to auscultation bilaterally. No rales or rhonchi. No wheezes.  ABDOMEN: Soft, flat, nontender, nondistended. Has good bowel sounds. No hepatosplenomegaly appreciated.  EXTREMITIES: No evidence of any cyanosis, clubbing, or peripheral edema.  +2 pedal and radial pulses bilaterally.  NEUROLOGIC: More awake sKIN: Moist and warm with no rashes  appreciated.  Psych: Not anxious, depressed LN: No inguinal LN enlargement    Antibiotics   Anti-infectives (From admission, onward)   Start     Dose/Rate Route Frequency Ordered Stop   02/03/18 1000  amoxicillin-clavulanate (AUGMENTIN) 875-125 MG per tablet 1 tablet     1 tablet Oral Every 12 hours 02/03/18 0802     02/01/18 1845  piperacillin-tazobactam (ZOSYN) IVPB 3.375 g  Status:  Discontinued     3.375 g 12.5 mL/hr over 240 Minutes Intravenous Every 8 hours 02/01/18 1838 02/03/18 0802   02/01/18 1045  cephALEXin (KEFLEX) capsule 500  mg  Status:  Discontinued     500 mg Oral 3 times daily 02/01/18 1044 02/01/18 1824   02/01/18 0745  levofloxacin (LEVAQUIN) IVPB 750 mg     750 mg 100 mL/hr over 90 Minutes Intravenous  Once 02/01/18 0736 02/01/18 0947      Medications   Scheduled Meds: . amoxicillin-clavulanate  1 tablet Oral Q12H  . aspirin  81 mg Oral Daily  . atorvastatin  40 mg Oral q1800  . clopidogrel  75 mg Oral Daily  . famotidine  20 mg Oral Daily  . ipratropium-albuterol  3 mL Nebulization TID  . loratadine  10 mg Oral Daily  . [START ON 02/05/2018] metoprolol succinate  12.5 mg Oral Daily  . midodrine  5 mg Oral TID WC  . mometasone-formoterol  2 puff Inhalation BID  . multivitamin with minerals  1 tablet Oral Daily  . pantoprazole  40 mg Oral Daily  . PARoxetine  10 mg Oral Daily  . predniSONE  2.5 mg Oral Q breakfast   Continuous Infusions: . sodium chloride 10 mL/hr at 02/02/18 0800   PRN Meds:.acetaminophen **OR** acetaminophen, ALPRAZolam, nitroGLYCERIN   Data Review:   Micro Results Recent Results (from the past 240 hour(s))  Blood culture (routine x 2)     Status: None (Preliminary result)   Collection Time: 02/01/18  7:21 AM  Result Value Ref Range Status   Specimen Description BLOOD LEFT ARM  Final   Special Requests   Final    BOTTLES DRAWN AEROBIC AND ANAEROBIC Blood Culture adequate volume   Culture   Final    NO GROWTH 3  DAYS Performed at [facility], [address] [place], [place] [address]    Report Status PENDING  Incomplete    Radiology Reports Dg Chest Port 1 View  Result Date: 02/01/2018 CLINICAL DATA:  Shortness of breath EXAM: PORTABLE CHEST 1 VIEW COMPARISON:  04/04/2015 FINDINGS: Cardiac shadow remains enlarged. Aortic calcifications are again seen. Small bilateral pleural effusions are noted with likely underlying basilar atelectasis. Some interstitial changes are noted on the right which may represent some mild interstitial edema. No bony abnormality is noted. IMPRESSION: Bilateral effusions increased from the prior exam with bibasilar atelectatic changes. Mild asymmetric density is noted on the right which may represent interstitial edema. Electronically Signed   By: Inez Catalina M.D.   On: 02/01/2018 07:48     CBC Recent Labs  Lab 02/01/18 0722 02/02/18 0620 02/03/18 0655 02/04/18 0345  WBC 10.0 13.7* 11.0 9.0  HGB 14.7 12.1 12.3 12.1  HCT 45.3 36.0 35.9 35.2  PLT 285 255 267 236  MCV 91.1 88.7 88.3 89.2  MCH 29.6 29.9 30.1 30.7  MCHC 32.5 33.7 34.1 34.4  RDW 16.0* 15.1* 15.2* 15.2*  LYMPHSABS 3.2  --   --   --   MONOABS 0.4  --   --   --   EOSABS 0.3  --   --   --   BASOSABS 0.1  --   --   --     Chemistries  Recent Labs  Lab 02/01/18 0722 02/01/18 2024 02/02/18 0243  NA 140 137 139  K 5.2* 4.8 4.0  CL 104 101 103  CO2 26 29 28   GLUCOSE 208* 201* 144*  BUN 25* 25* 27*  CREATININE 0.82 1.01* 0.75  CALCIUM 8.9 8.9 8.7*  MG  --   --  2.0   ------------------------------------------------------------------------------------------------------------------ estimated creatinine clearance is 35.7 mL/min (by C-G formula based on  SCr of 0.75 mg/dL). ------------------------------------------------------------------------------------------------------------------ No results for input(s): HGBA1C in the last 72  hours. ------------------------------------------------------------------------------------------------------------------ Recent Labs    02/02/18 0243  CHOL 136  HDL 68  LDLCALC 62  TRIG 32  CHOLHDL 2.0   ------------------------------------------------------------------------------------------------------------------ No results for input(s): TSH, T4TOTAL, T3FREE, THYROIDAB in the last 72 hours.  Invalid input(s): FREET3 ------------------------------------------------------------------------------------------------------------------ No results for input(s): VITAMINB12, FOLATE, FERRITIN, TIBC, IRON, RETICCTPCT in the last 72 hours.  Coagulation profile Recent Labs  Lab 02/01/18 0942  INR 0.90    No results for input(s): DDIMER in the last 72 hours.  Cardiac Enzymes Recent Labs  Lab 02/01/18 0722 02/01/18 2024 02/02/18 0243  TROPONINI 0.32* 2.64* 1.81*   ------------------------------------------------------------------------------------------------------------------ Invalid input(s): POCBNP    Assessment & Plan   IMPRESSION AND PLAN: Patient is 84 year old with COPD not on any oxygen presenting with shortness of breath  1.  Acute encephalopathy and agitation due to acute delirium  Now much improved   2.  Acute hypoxic respiratory failure due to acute systolic CHF  Due to low blood pressure DC Lasix, ACE inhibitor decreased dose of metoprolol with holding parameters Echocardiogram of the heart shows EF of 20% in 2016 EF was normal Cardiology has already seen the patient Patient now on room air  3.  Acute ST MI Continue heparin  Continue aspirin Lipitor 40 Toprol if tolerated  4.  COPD continue neb oral antibiotic  5.  Recent lower extremity cellulitis currently on oral Augmentin discontinue antibiotics  5.  Essential hypertension continue metoprolol  6, miscellaneous DVT prophylaxis with heparin      Code Status Orders  (From admission,  onward)         Start     Ordered   02/01/18 1045  Full code  Continuous     02/01/18 1044        Code Status History    Date Active Date Inactive Code Status Order ID Comments User Context   04/05/2015 0442 04/06/2015 1907 Full Code [id number]  Harrie Foreman, MD Inpatient        Gnosis poor discussed with the family to consider changing her to a DNR   Consults cardiology  DVT Prophylaxis heparin  Lab Results  Component Value Date   PLT 236 02/04/2018     Time Spent in minutes 25 minutes greater than 50% of time spent in care coordination and counseling patient regarding the condition and plan of care.   Dustin Flock M.D on 02/04/2018 at 1:37 PM  Between 7am to 6pm - Pager - [phone]  After 6pm go to [email or web address] - Proofreader  [employer]   Office  [phone]

## 2018-02-04 NOTE — Progress Notes (Addendum)
Patient's son chose [facility]. [facility] authorization is pending. [facility] admissions coordinator at [facility] is aware of accepted bed offer. Patient's son and daughter in law are interested in palliative care. CSW explained the difference between palliative and hospice. Family is agreeable to outpatient palliative care. CSW made referral to [facility] for outpatient palliative. CSW will continue to follow and assist as needed.   [employer], LCSW [phone]

## 2018-02-04 NOTE — Evaluation (Signed)
Occupational Therapy Evaluation Patient Details Name: Dana Austin MRN: [id number] DOB: 04-24-1925 Today's Date: 02/04/2018    History of Present Illness Pt is a 84 y.o. female presenting to hospital 02/01/18 with respiratory distress (sats 60% room air) and initially placed on Bipap.  Recent treatment R LE cellulitis.  Pt admitted with acute anterolateral STEMI (being medically managed) and acute hypoxic respiratory failure d/t acute CHF.  PMH includes h/o L MCA CVA, SDH, and tachycardia.   Clinical Impression   Pt seen for OT evaluation this date. Prior to hospital admission, pt was using a rollator and fairly independent with basic ADL. Pt lives with 3 different children in 3 different states for a couple months at a time.  Currently pt demonstrates impairments in strength (R>L), ROM, activity tolerance, balance, and cardiopulmonary status requiring min assist for bed mobility and transfers, mod-max assist for LB ADL, and min assist for UB ADL. Pt instructed in RW use and transfer training with emphasis on balance and hand/foot placement. Pt able to stand for 3 minutes with CGA to min assist for posterior lean and BP noted to drop to 80/63. Pt asymptomatic. Pt would benefit from skilled OT to address noted impairments and functional limitations (see below for any additional details) in order to maximize safety and independence while minimizing falls risk and caregiver burden.  Upon hospital discharge, recommend pt discharge to [place].    Follow Up Recommendations  SNF    Equipment Recommendations  3 in 1 bedside commode    Recommendations for Other Services       Precautions / Restrictions Precautions Precautions: Fall Restrictions Weight Bearing Restrictions: No      Mobility Bed Mobility Overal bed mobility: Needs Assistance Bed Mobility: Supine to Sit;Sit to Supine     Supine to sit: Min assist;HOB elevated Sit to supine: Min assist   General bed mobility comments: +effort,  2 assist for boosting up in bed at end   Transfers Overall transfer level: Needs assistance Equipment used: Rolling walker (2 wheeled) Transfers: Sit to/from Stand Sit to Stand: Min assist;Min guard         General transfer comment: verbal cues for hand placement    Balance Overall balance assessment: Needs assistance Sitting-balance support: Bilateral upper extremity supported;Feet supported Sitting balance-Leahy Scale: Fair     Standing balance support: Bilateral upper extremity supported Standing balance-Leahy Scale: Poor Standing balance comment: initial posterior lean standing requiring min to mod assist to shift weight forward with cues for use of RW                           ADL either performed or assessed with clinical judgement   ADL Overall ADL's : Needs assistance/impaired Eating/Feeding: Modified independent;Sitting   Grooming: Sitting;Set up;Supervision/safety   Upper Body Bathing: Sitting;Minimal assistance;Moderate assistance   Lower Body Bathing: Sit to/from stand;Moderate assistance;Maximal assistance   Upper Body Dressing : Sitting;Min guard   Lower Body Dressing: Sit to/from stand;Moderate assistance;Maximal assistance Lower Body Dressing Details (indicate cue type and reason): pt able to don L sock with min assist and max assist for R sock 2/2 weakness and decreased ROM/stiffness                     Vision Baseline Vision/History: Wears glasses Wears Glasses: At all times Patient Visual Report: No change from baseline       Perception     Praxis  Pertinent Vitals/Pain Pain Assessment: Faces Faces Pain Scale: Hurts a little bit Pain Location: B heels  Pain Descriptors / Indicators: Discomfort Pain Intervention(s): Monitored during session;Repositioned;Other (comment)((heels noted to be on pillow, OT adjusted pillow at end of session and educated CNA on placement to float heels))     Hand Dominance      Extremity/Trunk Assessment Upper Extremity Assessment Upper Extremity Assessment: Generalized weakness(RUE mildly weaker than LUE (previous CVA))   Lower Extremity Assessment Lower Extremity Assessment: Defer to PT evaluation;Generalized weakness(RLE weaker than LLE, decreased dorsiflexion noted bilaterally)   Cervical / Trunk Assessment Cervical / Trunk Assessment: Normal   Communication Communication Communication: HOH;Other (comment)(uses hearing aides per daughter-in-law; mild expressive difficulties from previous CVA)   Cognition Arousal/Alertness: Awake/alert Behavior During Therapy: WFL for tasks assessed/performed Overall Cognitive Status: Within Functional Limits for tasks assessed                                     General Comments  Pt BP in supine at start of session 98/57, sitting EOB 104/60, standing 3 minutes 80/63, long sitting in bed 100/61. HR 84-90, O2 sats on RA >95%.    Exercises Other Exercises Other Exercises: functional transfer training and balance training to improve independence for toilet transfers in future session   Shoulder Instructions      Home Living Family/patient expects to be discharged to:: Private residence Living Arrangements: Children Available Help at Discharge: Family;Available 24 hours/day Type of Home: House Home Access: Stairs to enter [employer] of Steps: 1 step from garage with no railing (uses wall for support) Entrance Stairs-Rails: None Home Layout: Two level;Able to live on main level with bedroom/bathroom     Bathroom Shower/Tub: Teacher, early years/pre: Standard     Home Equipment: Cane - single point;Walker - 4 wheels   Additional Comments: Pt stays with 3 different children (in 3 different states) for 5 months at a time.  Currently staying with daughter in [place] although she is on vacation (returning on September 8th).  Son and daughter in law from [place] currently staying with pt  at daughter's [place] home.      Prior Functioning/Environment Level of Independence: Needs assistance  Gait / Transfers Assistance Needed: Ambulates holding onto furniture during the day (sometimes uses 4ww at night); rarely uses SPC; uses 4ww in community but tends to only do shorter distances. ADL's / Homemaking Assistance Needed: Pt endorses needing some assist for socks/shoes occasionally, but has generally been fairly independent with ADL prior to admission, per pt report (family did not dispute)   Comments: Reports no falls in past 6 months.        OT Problem List: Decreased strength;Decreased knowledge of use of DME or AE;Decreased range of motion;Decreased activity tolerance;Cardiopulmonary status limiting activity;Impaired balance (sitting and/or standing);Decreased safety awareness      OT Treatment/Interventions: Self-care/ADL training;Balance training;Therapeutic exercise;Therapeutic activities;DME and/or AE instruction;Patient/family education    OT Goals(Current goals can be found in the care plan section) Acute Rehab OT Goals Patient Stated Goal: to be able to walk again OT Goal Formulation: With patient/family Time For Goal Achievement: 02/18/18 Potential to Achieve Goals: Good ADL Goals Pt Will Perform Lower Body Dressing: with min guard assist;sit to/from stand;with adaptive equipment(PRN min assist as needed from family) Pt Will Transfer to Toilet: with min guard assist;bedside commode;ambulating(LRAD for amb) Additional ADL Goal #1: Pt will perform bed mobility using  learned techniques with supervision assist. Additional ADL Goal #2: Pt will verbalize plan to implement at least 1 learned falls prevention strategy to minimize falls risk.  OT Frequency: Min 2X/week   Barriers to D/C:            Co-evaluation              AM-PAC PT "6 Clicks" Daily Activity     Outcome Measure Help from another person eating meals?: None Help from another person taking care  of personal grooming?: None Help from another person toileting, which includes using toliet, bedpan, or urinal?: A Little Help from another person bathing (including washing, rinsing, drying)?: A Lot Help from another person to put on and taking off regular upper body clothing?: A Little Help from another person to put on and taking off regular lower body clothing?: A Lot 6 Click Score: 18   End of Session Equipment Utilized During Treatment: Gait belt;Rolling walker  Activity Tolerance: Patient tolerated treatment well Patient left: in bed;with call bell/phone within reach;with bed alarm set;with nursing/sitter in room;with family/visitor present;Other (comment)(B heels floated)  OT Visit Diagnosis: Other abnormalities of gait and mobility (R26.89);Muscle weakness (generalized) (M62.81)                Time: 1140-1210 OT Time Calculation (min): 30 min Charges:  OT General Charges $OT Visit: 1 Visit OT Evaluation $OT Eval Moderate Complexity: 1 Mod OT Treatments $Self Care/Home Management : 8-22 mins  Jeni Salles, MPH, MS, OTR/L ascom [phone] 02/04/18, 1:19 PM

## 2018-02-04 NOTE — Care Management Important Message (Signed)
Important Message  Patient Details  Name: Dana Austin MRN: [id number] Date of Birth: May 02, 1925   Medicare Important Message Given:  Yes    Juliann Pulse A Beatris Belen 02/04/2018, 11:36 AM

## 2018-02-04 NOTE — Progress Notes (Signed)
ANTICOAGULATION CONSULT NOTE - Initial Consult  Pharmacy Consult for heparin Indication: chest pain/ACS  Allergies  Allergen Reactions  . Meloxicam Shortness Of Breath    Pt is not aware she is allergic to this medication. She "becomes short of breath all the time"     Patient Measurements: Height: 5\' 5"  (165.1 cm) Weight: 109 lb (49.4 kg) IBW/kg (Calculated) : 57 Heparin Dosing Weight: 43.1 kg  Vital Signs: Temp: 98.1 F (36.7 C) (09/02 0404) Temp Source: Oral (09/02 0404) BP: 114/58 (09/02 0404) Pulse Rate: 74 (09/02 0404)  Labs: Recent Labs    02/01/18 5956 02/01/18 3875  02/01/18 2024 02/02/18 0243 02/02/18 0620 02/03/18 0655 02/04/18 0345  HGB 14.7  --   --   --   --  12.1 12.3 12.1  HCT 45.3  --   --   --   --  36.0 35.9 35.2  PLT 285  --   --   --   --  255 267 236  APTT  --  24  --   --   --   --   --   --   LABPROT  --  12.1  --   --   --   --   --   --   INR  --  0.90  --   --   --   --   --   --   HEPARINUNFRC  --   --    < > 0.44  --  0.36 0.38 0.27*  CREATININE 0.82  --   --  1.01* 0.75  --   --   --   TROPONINI 0.32*  --   --  2.64* 1.81*  --   --   --    < > = values in this interval not displayed.    Estimated Creatinine Clearance: 35.7 mL/min (by C-G formula based on SCr of 0.75 mg/dL).  Assessment: 84 yo female with history of CVA and subdural hematoma presents to ED for respiratory distress.  Will medically manage at present with heparin and aspirin.  8/30 Heparin infusing at 500 units/hr, first Heparin level at 20:24  resulted at 0.44   Goal of Therapy:  Heparin level 0.3-0.7 units/ml Monitor platelets by anticoagulation protocol: Yes   Plan:  09/02 @ 0345 Heparin level 0.27. Level now subtherapeutic. Will increase infusion rate to heparin 600 units/hr. Recheck HL in 8 hours.    Will continue to check HL and CBC with AM labs daily per protocol.  Pernell Dupre, PharmD, BCPS Clinical Pharmacist 02/04/2018 4:42 AM

## 2018-02-05 DIAGNOSIS — Z741 Need for assistance with personal care: Secondary | ICD-10-CM | POA: Diagnosis not present

## 2018-02-05 DIAGNOSIS — Z7401 Bed confinement status: Secondary | ICD-10-CM | POA: Diagnosis not present

## 2018-02-05 DIAGNOSIS — L03116 Cellulitis of left lower limb: Secondary | ICD-10-CM | POA: Diagnosis not present

## 2018-02-05 DIAGNOSIS — F39 Unspecified mood [affective] disorder: Secondary | ICD-10-CM | POA: Diagnosis not present

## 2018-02-05 DIAGNOSIS — F419 Anxiety disorder, unspecified: Secondary | ICD-10-CM | POA: Diagnosis not present

## 2018-02-05 DIAGNOSIS — I2510 Atherosclerotic heart disease of native coronary artery without angina pectoris: Secondary | ICD-10-CM | POA: Diagnosis not present

## 2018-02-05 DIAGNOSIS — R2681 Unsteadiness on feet: Secondary | ICD-10-CM | POA: Diagnosis not present

## 2018-02-05 DIAGNOSIS — I110 Hypertensive heart disease with heart failure: Secondary | ICD-10-CM | POA: Diagnosis not present

## 2018-02-05 DIAGNOSIS — R278 Other lack of coordination: Secondary | ICD-10-CM | POA: Diagnosis not present

## 2018-02-05 DIAGNOSIS — G8191 Hemiplegia, unspecified affecting right dominant side: Secondary | ICD-10-CM | POA: Diagnosis not present

## 2018-02-05 DIAGNOSIS — R4182 Altered mental status, unspecified: Secondary | ICD-10-CM | POA: Diagnosis not present

## 2018-02-05 DIAGNOSIS — M6281 Muscle weakness (generalized): Secondary | ICD-10-CM | POA: Diagnosis not present

## 2018-02-05 DIAGNOSIS — I25119 Atherosclerotic heart disease of native coronary artery with unspecified angina pectoris: Secondary | ICD-10-CM | POA: Diagnosis not present

## 2018-02-05 DIAGNOSIS — J9601 Acute respiratory failure with hypoxia: Secondary | ICD-10-CM | POA: Diagnosis not present

## 2018-02-05 DIAGNOSIS — I5022 Chronic systolic (congestive) heart failure: Secondary | ICD-10-CM | POA: Diagnosis not present

## 2018-02-05 DIAGNOSIS — F329 Major depressive disorder, single episode, unspecified: Secondary | ICD-10-CM | POA: Diagnosis not present

## 2018-02-05 DIAGNOSIS — J449 Chronic obstructive pulmonary disease, unspecified: Secondary | ICD-10-CM | POA: Diagnosis not present

## 2018-02-05 DIAGNOSIS — R05 Cough: Secondary | ICD-10-CM | POA: Diagnosis not present

## 2018-02-05 DIAGNOSIS — I214 Non-ST elevation (NSTEMI) myocardial infarction: Secondary | ICD-10-CM | POA: Diagnosis not present

## 2018-02-05 DIAGNOSIS — J439 Emphysema, unspecified: Secondary | ICD-10-CM | POA: Diagnosis not present

## 2018-02-05 LAB — CALCIUM, IONIZED: CALCIUM, IONIZED, SERUM: 5 mg/dL (ref 4.5–5.6)

## 2018-02-05 LAB — BASIC METABOLIC PANEL
Anion gap: 5 (ref 5–15)
BUN: 22 mg/dL (ref 8–23)
CHLORIDE: 107 mmol/L (ref 98–111)
CO2: 27 mmol/L (ref 22–32)
CREATININE: 0.62 mg/dL (ref 0.44–1.00)
Calcium: 8.7 mg/dL — ABNORMAL LOW (ref 8.9–10.3)
GFR calc non Af Amer: >60 mL/min (ref >60)
GLUCOSE: 109 mg/dL — AB (ref 70–99)
Potassium: 4 mmol/L (ref 3.5–5.1)
Sodium: 139 mmol/L (ref 135–145)

## 2018-02-05 LAB — HEMOGLOBIN A1C
Hgb A1c MFr Bld: 6.5 % — ABNORMAL HIGH (ref 4.8–5.6)
MEAN PLASMA GLUCOSE: 139.85 mg/dL

## 2018-02-05 MED ORDER — ASPIRIN 81 MG PO CHEW: 81.0000 mg | CHEWABLE_TABLET | Freq: Every day | ORAL | 1 refills | Status: DC

## 2018-02-05 MED ORDER — IPRATROPIUM-ALBUTEROL 0.5-2.5 (3) MG/3ML IN SOLN: 3.0000 mL | RESPIRATORY_TRACT | Status: DC | PRN

## 2018-02-05 MED ORDER — CLOPIDOGREL BISULFATE 75 MG PO TABS: 75.0000 mg | ORAL_TABLET | Freq: Every day | ORAL | 1 refills | Status: DC

## 2018-02-05 MED ORDER — METOPROLOL SUCCINATE ER 25 MG PO TB24: 12.5000 mg | ORAL_TABLET | Freq: Every day | ORAL | 1 refills | Status: DC

## 2018-02-05 MED ORDER — MIDODRINE HCL 5 MG PO TABS: 5.0000 mg | ORAL_TABLET | Freq: Three times a day (TID) | ORAL | 0 refills | Status: DC

## 2018-02-05 MED ORDER — NITROGLYCERIN 0.4 MG SL SUBL: 0.4000 mg | SUBLINGUAL_TABLET | SUBLINGUAL | 12 refills | Status: DC | PRN

## 2018-02-05 NOTE — Progress Notes (Signed)
Progress Note  Patient Name: Dana Austin Date of Encounter: 02/05/2018  Primary Cardiologist: new to [facility] - consult by [place]  Subjective   No complaints this morning. No chest pain or dyspnea. No palpitations. BP improving this morning to the 258N systolic.   Inpatient Medications    Scheduled Meds: . aspirin  81 mg Oral Daily  . atorvastatin  40 mg Oral q1800  . clopidogrel  75 mg Oral Daily  . famotidine  20 mg Oral Daily  . ipratropium-albuterol  3 mL Nebulization TID  . loratadine  10 mg Oral Daily  . metoprolol succinate  12.5 mg Oral Daily  . midodrine  5 mg Oral TID WC  . mometasone-formoterol  2 puff Inhalation BID  . multivitamin with minerals  1 tablet Oral Daily  . pantoprazole  40 mg Oral Daily  . PARoxetine  10 mg Oral Daily  . predniSONE  2.5 mg Oral Q breakfast   Continuous Infusions: . sodium chloride 10 mL/hr at 02/02/18 0800   PRN Meds: acetaminophen **OR** acetaminophen, ALPRAZolam, nitroGLYCERIN   Vital Signs    Vitals:   02/04/18 1527 02/04/18 2001 02/04/18 2020 02/05/18 0352  BP:  (!) 95/52  125/64  Pulse:  86  86  Resp:  16  16  Temp:  99.2 F (37.3 C)  98.6 F (37 C)  TempSrc:  Oral  Oral  SpO2: 96% 97% 94% 96%  Weight:    47.6 kg  Height:        Intake/Output Summary (Last 24 hours) at 02/05/2018 0715 Last data filed at 02/04/2018 1813 Gross per 24 hour  Intake 171.47 ml  Output -  Net 171.47 ml   Filed Weights   02/03/18 0644 02/04/18 0404 02/05/18 0352  Weight: 51.3 kg 49.4 kg 47.6 kg    Telemetry    NSR with sinus arrhythmia and PACs - Personally Reviewed  ECG    n/a - Personally Reviewed  Physical Exam   GEN: Elderly and frail appearing; No acute distress.   Neck: No JVD. Cardiac: RRR, no murmurs, rubs, or gallops.  Respiratory: Diminished breath sounds bilaterally.  GI: Soft, nontender, non-distended.   MS: No edema; No deformity. Neuro:  Alert and oriented x 3; Nonfocal.  Psych: Normal affect.  Labs     Chemistry Recent Labs  Lab 02/01/18 2024 02/02/18 0243 02/05/18 0325  NA 137 139 139  K 4.8 4.0 4.0  CL 101 103 107  CO2 29 28 27   GLUCOSE 201* 144* 109*  BUN 25* 27* 22  CREATININE 1.01* 0.75 0.62  CALCIUM 8.9 8.7* 8.7*  GFRNONAA 47* >60 >60  GFRAA 55* >60 >60  ANIONGAP 7 8 5      Hematology Recent Labs  Lab 02/02/18 0620 02/03/18 0655 02/04/18 0345  WBC 13.7* 11.0 9.0  RBC 4.06 4.07 3.95  HGB 12.1 12.3 12.1  HCT 36.0 35.9 35.2  MCV 88.7 88.3 89.2  MCH 29.9 30.1 30.7  MCHC 33.7 34.1 34.4  RDW 15.1* 15.2* 15.2*  PLT 255 267 236    Cardiac Enzymes Recent Labs  Lab 02/01/18 0722 02/01/18 2024 02/02/18 0243  TROPONINI 0.32* 2.64* 1.81*   No results for input(s): TROPIPOC in the last 168 hours.   BNP Recent Labs  Lab 02/01/18 0722  BNP 1,990.0*     DDimer No results for input(s): DDIMER in the last 168 hours.   Radiology    No results found.  Cardiac Studies   2-D echo 02/01/2018: Study  Conclusions  - Left ventricle: The cavity size was severely dilated. Systolic   function was severely reduced. The estimated ejection fraction   was less than 20% Severe global akinesis with basal wall motion   best preserved. LV appears dilated and globular, possible regions   of aneurysm. The study is not technically sufficient to allow   evaluation of LV diastolic function. - Aortic valve: There was mild regurgitation. - Left atrium: The atrium was normal in size. - Right ventricle: Systolic function was normal. - Pulmonary arteries: Systolic pressure was within the normal   range.  Impressions:  - Images suggest severe global ischemic cardiomyopathy.  Patient Profile     84 y.o. female with history of PAF not on anticoagulation, COPD on chronic prednisone, stroke, prior falls with subdural hematoma, HTN, and HLD who presented to the hospital with respiratory distress with an anterior ST elevation MI with cardiac catheterization being declined by  interventional team given her frail state.   Assessment & Plan    1. Acute anteroseptal STEMI: -No chest pain -Troponin peaked at 2.64, now down trending -Cardiac cath has been deferred given her advanced age and frail state -Has completed 48 hours of heparin gtt for medical management -DAPT with ASA 81 mg and Plavix 75 mg daily  -Continue with conservative management  2. Acute systolic CHF secondary to ICM: -She does not appear grossly volume up -BNP upon admission of 1990 -May need a low-dose, standing diuretic given ehr cardiomyopathy and chronic prednisone usage for her COPD -Start Toprol XL 12.5 mg daily if BP allows -If BP allows escalate evidence-based heart failure therapy with ACEi/ARB/spironolactone -Doubt BP will allow for escalation of Entresto in place of ACEi/ARB -CHF education -Daily weights   3. Acute respiratory distress: -Improved -Will need standing low-dose Lasix as above  4. HTN: -Well controlled  -Hypotension improved -Planning for initiation of Toprol XL 12.5 mg daily if BP allows as above -Midodrine   5. HLD: -Remains on Lipitor -LDL this admission of 62 (goal < 70)  6. COPD: -Per IM -On chronic prednisone   7. Hyperglycemia: -Check A1c  8. Dispo: -Overall, poor prognosis with his risk of cardiopulmonary death given her severe ICM with recent anteroseptal STEMI as above -DNR -Daughter in [place] is [name] palliative care assistance   For questions or updates, please contact [place] Please consult [email or web address] for contact info under Cardiology/STEMI.    Signed, Christell Faith, PA-C [place] Pager: [phone] 02/05/2018, 7:15 AM

## 2018-02-05 NOTE — Clinical Social Work Placement (Signed)
   CLINICAL SOCIAL WORK PLACEMENT  NOTE  Date:  02/05/2018  Patient Details  Name: Dana Austin MRN: [id number] Date of Birth: 1924/10/04  Clinical Social Work is seeking post-discharge placement for this patient at the [place] level of care (*CSW will initial, date and re-position this form in  chart as items are completed):  Yes   Patient/family provided with [place] Work Department's list of facilities offering this level of care within the geographic area requested by the patient (or if unable, by the patient's family).  Yes   Patient/family informed of their freedom to choose among providers that offer the needed level of care, that participate in Medicare, Medicaid or managed care program needed by the patient, have an available bed and are willing to accept the patient.  Yes   Patient/family informed of [place]'s ownership interest in [facility] and [facility], as well as of the fact that they are under no obligation to receive care at these facilities.  PASRR submitted to EDS on 02/03/18     PASRR number received on 02/03/18     Existing PASRR number confirmed on       FL2 transmitted to all facilities in geographic area requested by pt/family on 02/03/18     FL2 transmitted to all facilities within larger geographic area on       Patient informed that his/her managed care company has contracts with or will negotiate with certain facilities, including the following:        Yes   Patient/family informed of bed offers received.  Patient chooses bed at [facility]     Physician recommends and patient chooses bed at      Patient to be transferred to [facility] on 02/05/18.  Patient to be transferred to facility by [facility]     Patient family notified on 02/05/18 of transfer.  Name of family member notified:  Patient's daughter in law [name]     PHYSICIAN Please sign FL2, Please sign DNR     Additional  Comment:    _______________________________________________ Ross Ludwig, LCSWA 02/05/2018, 2:13 PM

## 2018-02-05 NOTE — Discharge Summary (Addendum)
[facility] [place] NAME: Dana Austin    MR#:  [id number]  DATE OF BIRTH:  01-24-1925  DATE OF ADMISSION:  02/01/2018 ADMITTING PHYSICIAN: Dustin Flock, MD  DATE OF DISCHARGE: 02/05/2018  PRIMARY CARE PHYSICIAN: Tracie Harrier, MD    ADMISSION DIAGNOSIS:  Respiratory distress [R06.03] ST elevation myocardial infarction (STEMI), unspecified artery ([place]) [I21.3]  DISCHARGE DIAGNOSIS:  Acute STEMI--medical management  SECONDARY DIAGNOSIS:   Past Medical History:  Diagnosis Date  . Anxiety   . Asthma   . COPD (chronic obstructive pulmonary disease) ([place])    a. Chronic prednisone.  . Depression   . Essential hypertension   . GERD (gastroesophageal reflux disease)   . History of cardiac cath    a. 10/2009 Cath: reportedly nl.  . History of echocardiogram    a. 11/2014 Echo: Nl LVEF/RVEF. Triv AI/TR ([facility]).  . History of ischemic left MCA stroke    a. 08/2014 CVA in [place], [place] - treated w/ TPA. Residual R hemiparesis and aphasia.  . Hyperlipidemia   . Insomnia   . Lower extremity cellulitis    a. Dx 01/24/2018 - outpt Keflex.  . Onychomycosis   . PAF (paroxysmal atrial fibrillation) ([place])   . Skin cancer   . Subdural hematoma ([place])    a. 11/2014 in setting of syncope/fall.  . Syncope    a. 11/2014 syncope w/ fall and resultant T4 compression fx and SDH. Felt to be 2/2 overmedication and dehydration.  . Vitamin D deficiency     HOSPITAL COURSE:   84 year old with COPD not on any oxygen presenting with shortness of breath  1.  Acute encephalopathy and agitation due to acute delirium  Now much improved-- at baseline  2. Acute hypoxic respiratory failure due to acute systolic CHF  Due to low blood pressure DC Lasix, ACE inhibitor - decreased dose of metoprolol with holding parameters Echocardiogram of the heart shows EF of 20% in 2016 EF was normal Cardiology has already seen the patient Patient now on  room air  3.Acute ST MI Received medical management. Per cardiology evaluation pt not a candidate for cardiac cath. -pt is also not deemed a candidate for cardiac rehab given her age, chronic medical conditions and her overall fraile state Continue aspirin, plavix, statins,Toprol  4.COPD continue neb   5.Recent lower extremity cellulitis -was on augmentin  5.Essential hypertension  -continue metoprolol  70miscellaneous DVT prophylaxis with heparin  Patient will discharged to [facility] with palliative care to follow    CONSULTS OBTAINED:  Treatment Team:  Wellington Hampshire, MD  DRUG ALLERGIES:   Allergies  Allergen Reactions  . Meloxicam Shortness Of Breath    Pt is not aware she is allergic to this medication. She "becomes short of breath all the time"     DISCHARGE MEDICATIONS:   Allergies as of 02/05/2018      Reactions   Meloxicam Shortness Of Breath   Pt is not aware she is allergic to this medication. She "becomes short of breath all the time"       Medication List    STOP taking these medications   cephALEXin 500 MG capsule Commonly known as:  KEFLEX     TAKE these medications   acetaminophen 325 MG tablet Commonly known as:  TYLENOL Take 975 mg by mouth 3 (three) times daily.   ALPRAZolam 0.25 MG tablet Commonly known as:  XANAX Take 0.25 mg by mouth at bedtime as needed for  anxiety.   aspirin 81 MG chewable tablet Chew 1 tablet (81 mg total) by mouth daily. Start taking on:  02/06/2018   atorvastatin 40 MG tablet Commonly known as:  LIPITOR Take 40 mg by mouth daily.   budesonide-formoterol 160-4.5 MCG/ACT inhaler Commonly known as:  SYMBICORT Inhale 2 puffs into the lungs 2 (two) times daily.   clopidogrel 75 MG tablet Commonly known as:  PLAVIX Take 1 tablet (75 mg total) by mouth daily. Start taking on:  02/06/2018   loratadine 10 MG tablet Commonly known as:  CLARITIN Take 10 mg by mouth daily.   metoprolol succinate 25  MG 24 hr tablet Commonly known as:  TOPROL-XL Take 0.5 tablets (12.5 mg total) by mouth daily. Start taking on:  02/06/2018 What changed:  how much to take   midodrine 5 MG tablet Commonly known as:  PROAMATINE Take 1 tablet (5 mg total) by mouth 3 (three) times daily with meals.   multivitamin with minerals Tabs tablet Take 1 tablet by mouth daily.   nitroGLYCERIN 0.4 MG SL tablet Commonly known as:  NITROSTAT Place 1 tablet (0.4 mg total) under the tongue every 5 (five) minutes as needed for chest pain.   omeprazole 20 MG capsule Commonly known as:  PRILOSEC Take 20 mg by mouth daily.   PARoxetine 10 MG tablet Commonly known as:  PAXIL Take 10 mg by mouth daily.   predniSONE 2.5 MG tablet Commonly known as:  DELTASONE Take 2.5 mg by mouth daily with breakfast.       If you experience worsening of your admission symptoms, develop shortness of breath, life threatening emergency, suicidal or homicidal thoughts you must seek medical attention immediately by calling 911 or calling your MD immediately  if symptoms less severe.  You Must read complete instructions/literature along with all the possible adverse reactions/side effects for all the Medicines you take and that have been prescribed to you. Take any new Medicines after you have completely understood and accept all the possible adverse reactions/side effects.   Please note  You were cared for by a hospitalist during your hospital stay. If you have any questions about your discharge medications or the care you received while you were in the hospital after you are discharged, you can call the unit and asked to speak with the hospitalist on call if the hospitalist that took care of you is not available. Once you are discharged, your primary care physician will handle any further medical issues. Please note that NO REFILLS for any discharge medications will be authorized once you are discharged, as it is imperative that you return  to your primary care physician (or establish a relationship with a primary care physician if you do not have one) for your aftercare needs so that they can reassess your need for medications and monitor your lab values. Today   SUBJECTIVE   No new complaints  family in the room  VITAL SIGNS:  Blood pressure 133/88, pulse 96, temperature 98.1 F (36.7 C), temperature source Oral, resp. rate 16, height 5\' 5"  (1.651 m), weight 47.6 kg, SpO2 97 %.  I/O:    Intake/Output Summary (Last 24 hours) at 02/05/2018 1206 Last data filed at 02/04/2018 1813 Gross per 24 hour  Intake 137.95 ml  Output -  Net 137.95 ml    PHYSICAL EXAMINATION:  GENERAL:  84 y.o.-year-old patient lying in the bed with no acute distress. Thin  fraile  EYES: Pupils equal, round, reactive to light and accommodation. No  scleral icterus. Extraocular muscles intact.  HEENT: Head atraumatic, normocephalic. Oropharynx and nasopharynx clear.  NECK:  Supple, no jugular venous distention. No thyroid enlargement, no tenderness.  LUNGS: Normal breath sounds bilaterally, no wheezing, rales,rhonchi or crepitation. No use of accessory muscles of respiration.  CARDIOVASCULAR: S1, S2 normal. No murmurs, rubs, or gallops.  ABDOMEN: Soft, non-tender, non-distended. Bowel sounds present. No organomegaly or mass.  EXTREMITIES: No pedal edema, cyanosis, or clubbing.  NEUROLOGIC: Cranial nerves II through XII are intact. Muscle strength 5/5 in all extremities. Sensation intact. Gait not checked.  PSYCHIATRIC:patient is alert  SKIN: No obvious rash, lesion, or ulcer.   DATA REVIEW:   CBC  Recent Labs  Lab 02/04/18 0345  WBC 9.0  HGB 12.1  HCT 35.2  PLT 236    Chemistries  Recent Labs  Lab 02/02/18 0243 02/05/18 0325  NA 139 139  K 4.0 4.0  CL 103 107  CO2 28 27  GLUCOSE 144* 109*  BUN 27* 22  CREATININE 0.75 0.62  CALCIUM 8.7* 8.7*  MG 2.0  --     Microbiology Results   Recent Results (from the past 240 hour(s))   Blood culture (routine x 2)     Status: None (Preliminary result)   Collection Time: 02/01/18  7:21 AM  Result Value Ref Range Status   Specimen Description BLOOD LEFT ARM  Final   Special Requests   Final    BOTTLES DRAWN AEROBIC AND ANAEROBIC Blood Culture adequate volume   Culture   Final    NO GROWTH 4 DAYS Performed at [facility], [address] [place], [place] [address]    Report Status PENDING  Incomplete    RADIOLOGY:  No results found.   Management plans discussed with the patient, family and they are in agreement.  CODE STATUS:     Code Status Orders  (From admission, onward)         Start     Ordered   02/03/18 1603  Do not attempt resuscitation (DNR)  Continuous    Question Answer Comment  In the event of cardiac or respiratory ARREST Do not call a "code blue"   In the event of cardiac or respiratory ARREST Do not perform Intubation, CPR, defibrillation or ACLS   In the event of cardiac or respiratory ARREST Use medication by any route, position, wound care, and other measures to relive pain and suffering. May use oxygen, suction and manual treatment of airway obstruction as needed for comfort.      02/03/18 1603        Code Status History    Date Active Date Inactive Code Status Order ID Comments User Context   02/01/2018 1044 02/03/2018 1603 Full Code [id number]  Dustin Flock, MD Inpatient   04/05/2015 0442 04/06/2015 1907 Full Code [id number]  Harrie Foreman, MD Inpatient      TOTAL TIME TAKING CARE OF THIS PATIENT: *40 minutes.    Fritzi Mandes M.D on 02/05/2018 at 12:06 PM  Between 7am to 6pm - Pager - [phone] After 6pm go to [email or web address] - password [email or web address]  Office  [phone]  CC: Primary care physician; Tracie Harrier, MD

## 2018-02-05 NOTE — Progress Notes (Signed)
New referral for outpatient Palliative to follow at [facility] received from [place]. Patient information faxed to referral. Flo Shanks RN, [facility], [facility] [place], [facility] [phone]

## 2018-02-05 NOTE — Progress Notes (Signed)
Reviewed BETTER LIVING WITH HEART FAILURE with family members.

## 2018-02-05 NOTE — Clinical Social Work Note (Signed)
Patient to be d/c'ed today to room 326, at [facility].  Patient and family agreeable to plans will transport via ems RN to call report to [phone].  CSW spoke with patient's daughter in law Snavely [phone] to let her know patient will be discharging today.   Evette Cristal, MSW, [place]

## 2018-02-05 NOTE — Progress Notes (Addendum)
Health Team SNF authorization has been received, authorization # [id number], approved for 5 days. Clinical Education officer, museum (CSW) left patient's daughter in law a voicemail making her aware of above. Andrea admissions coordiantor at [facility] is aware of above. Patient can D/C to [facility] when medically stable.   Patient's daughter in law Livolsi called CSW back and was made aware of above. CSW made [place] aware that patient will have a co-pay $10-$20 per day. [place] verbalized her understanding.   [employer], LCSW [phone]

## 2018-02-05 NOTE — Progress Notes (Signed)
Pt has a severity score of 2 on the RT protocol assessment. She is not wheezing and is not in distress. The nebulizer treatments are changed to prn. The family was notified.

## 2018-02-06 LAB — CULTURE, BLOOD (ROUTINE X 2)
CULTURE: NO GROWTH
Special Requests: ADEQUATE

## 2018-02-07 DIAGNOSIS — I25119 Atherosclerotic heart disease of native coronary artery with unspecified angina pectoris: Secondary | ICD-10-CM | POA: Diagnosis not present

## 2018-02-07 DIAGNOSIS — G8191 Hemiplegia, unspecified affecting right dominant side: Secondary | ICD-10-CM | POA: Diagnosis not present

## 2018-02-07 DIAGNOSIS — I5022 Chronic systolic (congestive) heart failure: Secondary | ICD-10-CM | POA: Diagnosis not present

## 2018-02-07 DIAGNOSIS — F39 Unspecified mood [affective] disorder: Secondary | ICD-10-CM | POA: Diagnosis not present

## 2018-02-07 DIAGNOSIS — J439 Emphysema, unspecified: Secondary | ICD-10-CM | POA: Diagnosis not present

## 2018-02-07 DIAGNOSIS — R05 Cough: Secondary | ICD-10-CM | POA: Diagnosis not present

## 2018-02-20 ENCOUNTER — Ambulatory Visit: Payer: PPO | Admitting: Family

## 2018-02-27 ENCOUNTER — Other Ambulatory Visit: Payer: Self-pay

## 2018-02-27 DIAGNOSIS — I69351 Hemiplegia and hemiparesis following cerebral infarction affecting right dominant side: Secondary | ICD-10-CM | POA: Diagnosis not present

## 2018-02-27 DIAGNOSIS — I5020 Unspecified systolic (congestive) heart failure: Secondary | ICD-10-CM | POA: Diagnosis not present

## 2018-02-27 DIAGNOSIS — Z7902 Long term (current) use of antithrombotics/antiplatelets: Secondary | ICD-10-CM | POA: Diagnosis not present

## 2018-02-27 DIAGNOSIS — I214 Non-ST elevation (NSTEMI) myocardial infarction: Secondary | ICD-10-CM | POA: Diagnosis not present

## 2018-02-27 DIAGNOSIS — K219 Gastro-esophageal reflux disease without esophagitis: Secondary | ICD-10-CM | POA: Diagnosis not present

## 2018-02-27 DIAGNOSIS — E785 Hyperlipidemia, unspecified: Secondary | ICD-10-CM | POA: Diagnosis not present

## 2018-02-27 DIAGNOSIS — J449 Chronic obstructive pulmonary disease, unspecified: Secondary | ICD-10-CM | POA: Diagnosis not present

## 2018-02-27 DIAGNOSIS — M6281 Muscle weakness (generalized): Secondary | ICD-10-CM | POA: Diagnosis not present

## 2018-02-27 DIAGNOSIS — F39 Unspecified mood [affective] disorder: Secondary | ICD-10-CM | POA: Diagnosis not present

## 2018-02-27 DIAGNOSIS — I110 Hypertensive heart disease with heart failure: Secondary | ICD-10-CM | POA: Diagnosis not present

## 2018-02-27 DIAGNOSIS — Z7951 Long term (current) use of inhaled steroids: Secondary | ICD-10-CM | POA: Diagnosis not present

## 2018-02-27 DIAGNOSIS — I25119 Atherosclerotic heart disease of native coronary artery with unspecified angina pectoris: Secondary | ICD-10-CM | POA: Diagnosis not present

## 2018-02-27 DIAGNOSIS — I4891 Unspecified atrial fibrillation: Secondary | ICD-10-CM | POA: Diagnosis not present

## 2018-02-27 NOTE — Patient Outreach (Signed)
[facility]) Care Management  02/27/2018  Tamira Ryland [birth year] [id number]     Transition of Care Referral  Referral Date: 02/27/18 Referral Source: HTA Discharge Report Date of Admission: unknown Diagnosis: unknown Date of Discharge: 02/22/18 Facility: [place]: HTA    Outreach attempt # 1 to patient. No answer at present and unable to leave message.    Plan: RN CM will make outreach attempt to patient within 3-4 business days.    Enzo Montgomery, RN,BSN,CCM [place] Management Telephonic Care Management Coordinator Direct Phone: [phone] Toll Free: [phone] Fax: [phone]

## 2018-02-28 ENCOUNTER — Other Ambulatory Visit: Payer: Self-pay

## 2018-02-28 DIAGNOSIS — Z09 Encounter for follow-up examination after completed treatment for conditions other than malignant neoplasm: Secondary | ICD-10-CM | POA: Diagnosis not present

## 2018-02-28 DIAGNOSIS — I2510 Atherosclerotic heart disease of native coronary artery without angina pectoris: Secondary | ICD-10-CM | POA: Diagnosis not present

## 2018-02-28 DIAGNOSIS — Z8673 Personal history of transient ischemic attack (TIA), and cerebral infarction without residual deficits: Secondary | ICD-10-CM | POA: Diagnosis not present

## 2018-02-28 DIAGNOSIS — J449 Chronic obstructive pulmonary disease, unspecified: Secondary | ICD-10-CM | POA: Diagnosis not present

## 2018-02-28 DIAGNOSIS — I159 Secondary hypertension, unspecified: Secondary | ICD-10-CM | POA: Diagnosis not present

## 2018-02-28 DIAGNOSIS — I5021 Acute systolic (congestive) heart failure: Secondary | ICD-10-CM | POA: Diagnosis not present

## 2018-02-28 NOTE — Patient Outreach (Signed)
[facility]) Care Management  02/28/2018  Jamesyn [place] [birth year] [id number]   Transition of Care Referral  Referral Date: 02/27/18 Referral Source: HTA Discharge Report Date of Admission: unknown Diagnosis: unknown Date of Discharge: 02/22/18 Facility: [place]: HTA   RN CM received referral from HTA UM Dept regarding the same referal reason-recent discharge from facility. Outreach attempt #2 to patient. No answer at present and unable to leave message.     Plan: RN CM will make outreach attempt to patient within 3-4 business days.   Enzo Montgomery, RN,BSN,CCM [place] Management Telephonic Care Management Coordinator Direct Phone: [phone] Toll Free: [phone] Fax: [phone]

## 2018-03-01 ENCOUNTER — Other Ambulatory Visit: Payer: Self-pay

## 2018-03-01 NOTE — Patient Outreach (Signed)
[facility]) Care Management  03/01/2018  Dana Austin [birth year] [id number]   Transition of Care Referral  Referral Date:02/27/18 Referral Source:HTA Discharge Report Date of Admission:unknown Diagnosis:unknown Date of Discharge:02/22/18 Facility:[facility]   RN CM received referral from HTA UM Dept regarding the same referral reason-recent discharge from facility. Outreach attempt #3 to patient. Spoke with daughter-Nancy. She was not very engaging in conversation and provided limited info. She voices that things are going well. She denies any acute needs or concerns at this time. Patient had PCP follow up appt on yesterday and reviewed meds so daughter did not wish to complete med review again over the phone. She denies any issues or concerns regarding meds. She states that patient goes to see cardiologist on 03/05/18. Daughter is providing transportation and taking patient to appts. Caregiver states that [place] has been out to assess and start working with patient and things going fine with that. Daughter does not feel like she needs [facility] services at this time voicing they are trying to get settled and back to normal routine. Advised her that RN CM has mailed out [facility] brochure and contact info for future reference in case needs arise in the future. She voiced understanding.      Plan:  RN CM will close case at this time.   Enzo Montgomery, RN,BSN,CCM [place] Management Telephonic Care Management Coordinator Direct Phone: [phone] Toll Free: [phone] Fax: [phone]

## 2018-03-03 DIAGNOSIS — I5043 Acute on chronic combined systolic (congestive) and diastolic (congestive) heart failure: Secondary | ICD-10-CM | POA: Insufficient documentation

## 2018-03-03 DIAGNOSIS — I420 Dilated cardiomyopathy: Secondary | ICD-10-CM | POA: Insufficient documentation

## 2018-03-03 DIAGNOSIS — I2510 Atherosclerotic heart disease of native coronary artery without angina pectoris: Secondary | ICD-10-CM | POA: Insufficient documentation

## 2018-03-03 NOTE — Progress Notes (Signed)
Cardiology Office Note  Date:  03/05/2018   ID:  Dana Austin, DOB [birth year], MRN [id number]  PCP:  Tracie Harrier, MD   Chief Complaint  Patient presents with  . OTHER    F/u STEMI c/o swelling left leg. Meds reviewed verbally with pt.    HPI:  Dana Austin is a 84 yo old woman with history of  COPD on chronic prednisone,  stroke,  paroxysmal atrial fibrillation,  prior falls with subdural hematoma,  presented to the hospital 02/2018 with respiratory distress, markedly abnormal EKG concerning for ischemia, cardiac catheterization declined by interventional team given her frail state Echo showing EF 20% Troponin peak of 2.64  DNR/DNI  In follow-up today she is working with PT and OT at home She presents with family Little bit of leg swelling, little bit of shortness of breath but not bad She is on midodrine for blood pressure support They do not think she has been orthostatic but very wobbly when walking secondary to leg weakness Denies any chest pain on exertion They do have nitro if needed Long discussion concerning recent hospital events  Long discussion concerning CHF management, ischemia management  EKG personally reviewed by myself on todays visit Shows normal sinus rhythm rate 73 bpm old anterior MI  Records reviewed with them in detail Echocardiogram - Left ventricle: The cavity size was severely dilated. Systolic function was severely reduced. The estimated ejection fraction was less than 20% Severe global akinesis with basal wall motion best preserved. LV appears dilated and globular, possible regions of aneurysm. The study is not technically sufficient to allow evaluation of LV diastolic function. - Aortic valve: There was mild regurgitation. - Left atrium: The atrium was normal in size. - Right ventricle: Systolic function was normal. - Pulmonary arteries: Systolic pressure was within the normal range.    PMH:   has a past medical  history of Anxiety, Asthma, COPD (chronic obstructive pulmonary disease) ([place]), Depression, Essential hypertension, GERD (gastroesophageal reflux disease), History of cardiac cath, History of echocardiogram, History of ischemic left MCA stroke, Hyperlipidemia, Insomnia, Lower extremity cellulitis, Onychomycosis, PAF (paroxysmal atrial fibrillation) ([place]), Skin cancer, Subdural hematoma ([place]), Syncope, and Vitamin D deficiency.  PSH:    Past Surgical History:  Procedure Laterality Date  . none      Current Outpatient Medications  Medication Sig Dispense Refill  . ALPRAZolam (XANAX) 0.25 MG tablet Take 0.25 mg by mouth at bedtime as needed for anxiety.    Marland Kitchen atorvastatin (LIPITOR) 40 MG tablet Take 1 tablet (40 mg total) by mouth daily. 90 tablet 3  . budesonide-formoterol (SYMBICORT) 160-4.5 MCG/ACT inhaler Inhale 2 puffs into the lungs 2 (two) times daily.    . clopidogrel (PLAVIX) 75 MG tablet Take 1 tablet (75 mg total) by mouth daily. 90 tablet 3  . metoprolol succinate (TOPROL-XL) 25 MG 24 hr tablet Take 0.5 tablets (12.5 mg total) by mouth daily. 30 tablet 1  . midodrine (PROAMATINE) 5 MG tablet Take 1 tablet (5 mg total) by mouth 3 (three) times daily with meals. 270 tablet 3  . Multiple Vitamin (MULTIVITAMIN WITH MINERALS) TABS tablet Take 1 tablet by mouth daily.    . Multiple Vitamins-Minerals (PRESERVISION AREDS 2 PO) Take by mouth daily.    . nitroGLYCERIN (NITROSTAT) 0.4 MG SL tablet Place 1 tablet (0.4 mg total) under the tongue every 5 (five) minutes as needed for chest pain. 20 tablet 12  . PARoxetine (PAXIL) 10 MG tablet Take 10 mg by mouth daily.     Marland Kitchen  predniSONE (DELTASONE) 2.5 MG tablet Take 2.5 mg by mouth daily with breakfast.     No current facility-administered medications for this visit.      Allergies:   Meloxicam   Social History:  The patient  reports that she has never smoked. She has never used smokeless tobacco. She reports that she does not drink alcohol or  use drugs.   Family History:   family history includes CAD in her brother; Diabetes Mellitus II in her mother.    Review of Systems: Review of Systems  Constitutional: Positive for malaise/fatigue.  Respiratory: Negative.   Cardiovascular: Negative.   Gastrointestinal: Negative.   Musculoskeletal: Negative.        Leg weakness  Neurological: Negative.   Psychiatric/Behavioral: Negative.   All other systems reviewed and are negative.    PHYSICAL EXAM: VS:  BP 109/61 (BP Location: Right Arm, Patient Position: Sitting, Cuff Size: Normal)   Pulse 73   Ht 5\' 3"  (1.6 m)   Wt 92 lb 12 oz (42.1 kg)   BMI 16.43 kg/m  , BMI Body mass index is 16.43 kg/m. GEN: Thin, frail , in no acute distress presenting in a wheelchair HEENT: normal  Neck: no JVD, carotid bruits, or masses Cardiac: RRR; no murmurs, rubs, or gallops,no edema  Respiratory:  clear to auscultation bilaterally, normal work of breathing GI: soft, nontender, nondistended, + BS MS: no deformity or atrophy  Skin: warm and dry, no rash Neuro:  Strength and sensation are intact Psych: euthymic mood, full affect   Recent Labs: 02/01/2018: B Natriuretic Peptide 1,990.0 02/02/2018: Magnesium 2.0 02/04/2018: Hemoglobin 12.1; Platelets 236 02/05/2018: BUN 22; Creatinine, Ser 0.62; Potassium 4.0; Sodium 139    Lipid Panel Lab Results  Component Value Date   CHOL 136 02/02/2018   HDL 68 02/02/2018   LDLCALC 62 02/02/2018   TRIG 32 02/02/2018      Wt Readings from Last 3 Encounters:  03/05/18 92 lb 12 oz (42.1 kg)  02/05/18 105 lb (47.6 kg)  06/16/15 100 lb (45.4 kg)       ASSESSMENT AND PLAN:  Centrilobular emphysema (HCC) Family reports that she did not smoke but has a diagnosis of COPD  Dilated cardiomyopathy ([place]) - Plan: EKG 12-Lead Long discussion with family Unable to add further medications for heart failure given low blood pressure We have provided Lasix to take as needed for worsening leg swelling  abdominal bloating shortness of breath coughing when supine CHF management discussed in detail  Acute on chronic combined systolic and diastolic CHF (congestive heart failure) ([place]) Long discussion concerning watching her weight, looking for signs of heart failure and taking Lasix appropriately Likely will not need to take this daily given her low fluid intake  Coronary artery disease involving native coronary artery of native heart with unstable angina pectoris ([place]) - Plan: EKG 12-Lead Underlying ischemia based on low ejection fraction and EKG We will treat with Plavix and statin for now Not having any anginal symptoms Cardiac catheterization deferred This was discussed again with family who feels it is the right decision  Paroxysmal atrial fibrillation ([place]) - Plan: EKG 12-Lead Maintaining normal sinus rhythm  Disposition:   F/U  3 months   Total encounter time more than 60 minutes  Greater than 50% was spent in counseling and coordination of care with the patient    Orders Placed This Encounter  Procedures  . EKG 12-Lead     Signed, Esmond Plants, M.D., Ph.D. 03/05/2018  [place]  [place], [place]

## 2018-03-05 ENCOUNTER — Ambulatory Visit: Payer: PPO | Admitting: Cardiovascular Disease

## 2018-03-05 ENCOUNTER — Encounter: Payer: Self-pay | Admitting: Cardiovascular Disease

## 2018-03-05 VITALS — BP 109/61 | HR 73 | Ht 63.0 in | Wt 92.8 lb

## 2018-03-05 DIAGNOSIS — J9601 Acute respiratory failure with hypoxia: Secondary | ICD-10-CM | POA: Diagnosis not present

## 2018-03-05 DIAGNOSIS — Z7951 Long term (current) use of inhaled steroids: Secondary | ICD-10-CM | POA: Diagnosis not present

## 2018-03-05 DIAGNOSIS — J432 Centrilobular emphysema: Secondary | ICD-10-CM | POA: Diagnosis not present

## 2018-03-05 DIAGNOSIS — I480 Paroxysmal atrial fibrillation: Secondary | ICD-10-CM | POA: Diagnosis not present

## 2018-03-05 DIAGNOSIS — I110 Hypertensive heart disease with heart failure: Secondary | ICD-10-CM | POA: Diagnosis not present

## 2018-03-05 DIAGNOSIS — Z7902 Long term (current) use of antithrombotics/antiplatelets: Secondary | ICD-10-CM | POA: Diagnosis not present

## 2018-03-05 DIAGNOSIS — J9602 Acute respiratory failure with hypercapnia: Secondary | ICD-10-CM

## 2018-03-05 DIAGNOSIS — I25110 Atherosclerotic heart disease of native coronary artery with unstable angina pectoris: Secondary | ICD-10-CM

## 2018-03-05 DIAGNOSIS — K219 Gastro-esophageal reflux disease without esophagitis: Secondary | ICD-10-CM | POA: Diagnosis not present

## 2018-03-05 DIAGNOSIS — M6281 Muscle weakness (generalized): Secondary | ICD-10-CM | POA: Diagnosis not present

## 2018-03-05 DIAGNOSIS — I420 Dilated cardiomyopathy: Secondary | ICD-10-CM | POA: Diagnosis not present

## 2018-03-05 DIAGNOSIS — I25119 Atherosclerotic heart disease of native coronary artery with unspecified angina pectoris: Secondary | ICD-10-CM | POA: Diagnosis not present

## 2018-03-05 DIAGNOSIS — I4891 Unspecified atrial fibrillation: Secondary | ICD-10-CM | POA: Diagnosis not present

## 2018-03-05 DIAGNOSIS — E785 Hyperlipidemia, unspecified: Secondary | ICD-10-CM | POA: Diagnosis not present

## 2018-03-05 DIAGNOSIS — I5020 Unspecified systolic (congestive) heart failure: Secondary | ICD-10-CM | POA: Diagnosis not present

## 2018-03-05 DIAGNOSIS — I5043 Acute on chronic combined systolic (congestive) and diastolic (congestive) heart failure: Secondary | ICD-10-CM

## 2018-03-05 DIAGNOSIS — J449 Chronic obstructive pulmonary disease, unspecified: Secondary | ICD-10-CM | POA: Diagnosis not present

## 2018-03-05 DIAGNOSIS — F39 Unspecified mood [affective] disorder: Secondary | ICD-10-CM | POA: Diagnosis not present

## 2018-03-05 DIAGNOSIS — I69351 Hemiplegia and hemiparesis following cerebral infarction affecting right dominant side: Secondary | ICD-10-CM | POA: Diagnosis not present

## 2018-03-05 DIAGNOSIS — I214 Non-ST elevation (NSTEMI) myocardial infarction: Secondary | ICD-10-CM | POA: Diagnosis not present

## 2018-03-05 MED ORDER — ATORVASTATIN CALCIUM 40 MG PO TABS: 40.0000 mg | ORAL_TABLET | Freq: Every day | ORAL | 3 refills | Status: DC

## 2018-03-05 MED ORDER — CLOPIDOGREL BISULFATE 75 MG PO TABS: 75.0000 mg | ORAL_TABLET | Freq: Every day | ORAL | 3 refills | Status: DC

## 2018-03-05 MED ORDER — FUROSEMIDE 20 MG PO TABS: 20.0000 mg | ORAL_TABLET | Freq: Every day | ORAL | 3 refills | Status: AC | PRN

## 2018-03-05 MED ORDER — MIDODRINE HCL 5 MG PO TABS: 5.0000 mg | ORAL_TABLET | Freq: Three times a day (TID) | ORAL | 3 refills | Status: DC

## 2018-03-05 NOTE — Patient Instructions (Addendum)
Medication Instructions:   Please take lasix/furosemide daily as needed for shortness of breath, cough, leg swelling, weight gain, ABD bloating  Labwork:  No new labs needed  Testing/Procedures:  No further testing at this time   Follow-Up: It was a pleasure seeing you in the office today. Please call us if you have new issues that need to be addressed before your next appt.  [phone]  Your physician wants you to follow-up in: 3 months.  You will receive a reminder letter in the mail two months in advance. If you don't receive a letter, please call our office to schedule the follow-up appointment.  If you need a refill on your cardiac medications before your next appointment, please call your pharmacy.  For educational health videos Log in to : [email or web address] Or : [email or web address], password : [email or web address]

## 2018-03-06 DIAGNOSIS — I69351 Hemiplegia and hemiparesis following cerebral infarction affecting right dominant side: Secondary | ICD-10-CM | POA: Diagnosis not present

## 2018-03-06 DIAGNOSIS — J449 Chronic obstructive pulmonary disease, unspecified: Secondary | ICD-10-CM | POA: Diagnosis not present

## 2018-03-06 DIAGNOSIS — M6281 Muscle weakness (generalized): Secondary | ICD-10-CM | POA: Diagnosis not present

## 2018-03-06 DIAGNOSIS — F39 Unspecified mood [affective] disorder: Secondary | ICD-10-CM | POA: Diagnosis not present

## 2018-03-06 DIAGNOSIS — I110 Hypertensive heart disease with heart failure: Secondary | ICD-10-CM | POA: Diagnosis not present

## 2018-03-06 DIAGNOSIS — E785 Hyperlipidemia, unspecified: Secondary | ICD-10-CM | POA: Diagnosis not present

## 2018-03-06 DIAGNOSIS — I4891 Unspecified atrial fibrillation: Secondary | ICD-10-CM | POA: Diagnosis not present

## 2018-03-06 DIAGNOSIS — I5020 Unspecified systolic (congestive) heart failure: Secondary | ICD-10-CM | POA: Diagnosis not present

## 2018-04-12 DIAGNOSIS — Z111 Encounter for screening for respiratory tuberculosis: Secondary | ICD-10-CM | POA: Diagnosis not present

## 2018-04-15 DIAGNOSIS — Z111 Encounter for screening for respiratory tuberculosis: Secondary | ICD-10-CM | POA: Diagnosis not present

## 2018-05-27 DIAGNOSIS — L03119 Cellulitis of unspecified part of limb: Secondary | ICD-10-CM | POA: Diagnosis not present

## 2018-05-27 DIAGNOSIS — F0150 Vascular dementia without behavioral disturbance: Secondary | ICD-10-CM | POA: Diagnosis not present

## 2018-05-27 DIAGNOSIS — B351 Tinea unguium: Secondary | ICD-10-CM | POA: Diagnosis not present

## 2018-05-27 DIAGNOSIS — J449 Chronic obstructive pulmonary disease, unspecified: Secondary | ICD-10-CM | POA: Diagnosis not present

## 2018-05-27 DIAGNOSIS — I2510 Atherosclerotic heart disease of native coronary artery without angina pectoris: Secondary | ICD-10-CM | POA: Diagnosis not present

## 2018-05-27 DIAGNOSIS — Z8673 Personal history of transient ischemic attack (TIA), and cerebral infarction without residual deficits: Secondary | ICD-10-CM | POA: Diagnosis not present

## 2018-06-24 DIAGNOSIS — E440 Moderate protein-calorie malnutrition: Secondary | ICD-10-CM | POA: Diagnosis not present

## 2018-06-24 DIAGNOSIS — I5022 Chronic systolic (congestive) heart failure: Secondary | ICD-10-CM | POA: Diagnosis not present

## 2018-06-24 DIAGNOSIS — J449 Chronic obstructive pulmonary disease, unspecified: Secondary | ICD-10-CM | POA: Diagnosis not present

## 2018-06-24 DIAGNOSIS — Z8673 Personal history of transient ischemic attack (TIA), and cerebral infarction without residual deficits: Secondary | ICD-10-CM | POA: Diagnosis not present

## 2018-06-24 DIAGNOSIS — I159 Secondary hypertension, unspecified: Secondary | ICD-10-CM | POA: Diagnosis not present

## 2018-06-24 DIAGNOSIS — Z0000 Encounter for general adult medical examination without abnormal findings: Secondary | ICD-10-CM | POA: Diagnosis not present

## 2018-06-24 DIAGNOSIS — L309 Dermatitis, unspecified: Secondary | ICD-10-CM | POA: Diagnosis not present

## 2018-10-17 ENCOUNTER — Telehealth: Payer: Self-pay

## 2018-10-17 NOTE — Telephone Encounter (Signed)
Spoke with Dana Austin regarding scheduling overdue F/U appt for patient with Dr. Rockey Situ. He states she is staying with her daughter in [place] for the next few months so he requested for our office to call back at a later time. Advised him that a recall will be placed for 01/04/2019.

## 2019-01-14 ENCOUNTER — Telehealth: Payer: Self-pay | Admitting: Cardiovascular Disease

## 2019-01-14 NOTE — Telephone Encounter (Signed)
Returned call to pharmacy to confirm dosage of Midodrine. At last OV 03/2018 med was written TID. Per daughter report, it is given BID.   According to our records TID was last prescribed.   No further questions at this time.

## 2019-01-14 NOTE — Telephone Encounter (Signed)
Pharmacy calling to verify correct dosage amount of Midodrine 5 mg. Pharmacy states it is 3xday however the daughter is stating the dose is 2x a day. Please advise pharmacy

## 2019-02-28 DIAGNOSIS — I159 Secondary hypertension, unspecified: Secondary | ICD-10-CM | POA: Diagnosis not present

## 2019-02-28 DIAGNOSIS — Z8673 Personal history of transient ischemic attack (TIA), and cerebral infarction without residual deficits: Secondary | ICD-10-CM | POA: Diagnosis not present

## 2019-02-28 DIAGNOSIS — F0150 Vascular dementia without behavioral disturbance: Secondary | ICD-10-CM | POA: Diagnosis not present

## 2019-02-28 DIAGNOSIS — J440 Chronic obstructive pulmonary disease with acute lower respiratory infection: Secondary | ICD-10-CM | POA: Diagnosis not present

## 2019-02-28 DIAGNOSIS — J449 Chronic obstructive pulmonary disease, unspecified: Secondary | ICD-10-CM | POA: Diagnosis not present

## 2019-02-28 DIAGNOSIS — I2510 Atherosclerotic heart disease of native coronary artery without angina pectoris: Secondary | ICD-10-CM | POA: Diagnosis not present

## 2019-02-28 DIAGNOSIS — J209 Acute bronchitis, unspecified: Secondary | ICD-10-CM | POA: Diagnosis not present

## 2019-02-28 DIAGNOSIS — Z0000 Encounter for general adult medical examination without abnormal findings: Secondary | ICD-10-CM | POA: Diagnosis not present

## 2019-05-15 ENCOUNTER — Telehealth: Payer: Self-pay

## 2019-05-15 MED ORDER — MIDODRINE HCL 5 MG PO TABS: 5.0000 mg | ORAL_TABLET | Freq: Three times a day (TID) | ORAL | 0 refills | Status: DC

## 2019-05-15 NOTE — Telephone Encounter (Signed)
Virtual Visit Pre-Appointment Phone Call  "Dana Austin, I am calling you today to discuss your upcoming appointment. We are currently trying to limit exposure to the virus that causes COVID-19 by seeing patients at home rather than in the office."  1. "What is the BEST phone number to call the day of the visit?" - include this in appointment notes  2. "Do you have or have access to (through a family member/friend) a smartphone with video capability that we can use for your visit?" a. If yes - list this number in appt notes as "cell" (if different from BEST phone #) and list the appointment type as a VIDEO visit in appointment notes b. If no - list the appointment type as a PHONE visit in appointment notes  3. Confirm consent - "In the setting of the current Covid19 crisis, you are scheduled for a video visit with your provider on 06/23/2019 at 3:40PM.  Just as we do with many in-office visits, in order for you to participate in this visit, we must obtain consent.  If you'd like, I can send this to your mychart (if signed up) or email for you to review.  Otherwise, I can obtain your verbal consent now.  All virtual visits are billed to your insurance company just like a normal visit would be.  By agreeing to a virtual visit, we'd like you to understand that the technology does not allow for your provider to perform an examination, and thus may limit your provider's ability to fully assess your condition. If your provider identifies any concerns that need to be evaluated in person, we will make arrangements to do so.  Finally, though the technology is pretty good, we cannot assure that it will always work on either your or our end, and in the setting of a video visit, we may have to convert it to a phone-only visit.  In either situation, we cannot ensure that we have a secure connection.  Are you willing to proceed?" STAFF: Did the patient verbally acknowledge consent to telehealth visit? Document YES/NO here:  YES PER DAUGHTER [name]  4. Advise patient to be prepared - "Two hours prior to your appointment, go ahead and check your blood pressure, pulse, oxygen saturation, and your weight (if you have the equipment to check those) and write them all down. When your visit starts, your provider will ask you for this information. If you have an Apple Watch or Kardia device, please plan to have heart rate information ready on the day of your appointment. Please have a pen and paper handy nearby the day of the visit as well."  5. Give patient instructions for MyChart download to smartphone OR Doximity/Doxy.me as below if video visit (depending on what platform provider is using)  6. Inform patient they will receive a phone call 15 minutes prior to their appointment time (may be from unknown caller ID) so they should be prepared to answer    TELEPHONE CALL NOTE  Dana Austin has been deemed a candidate for a follow-up tele-health visit to limit community exposure during the Covid-19 pandemic. I spoke with the patient via phone to ensure availability of phone/video source, confirm preferred email & phone number, and discuss instructions and expectations.  I reminded Dana Austin to be prepared with any vital sign and/or heart rhythm information that could potentially be obtained via home monitoring, at the time of her visit. I reminded Dana Austin to expect a phone call  prior to her visit.  Lahoma Rocker Newcomer McClain 05/15/2019 1:40 PM   INSTRUCTIONS FOR DOWNLOADING THE MYCHART APP TO SMARTPHONE  - The patient must first make sure to have activated MyChart and know their login information - If Apple, go to [employer] and type in MyChart in the search bar and download the app. If Android, ask patient to go to [employer] and type in [facility] in the search bar and download the app. The app is free but as with any other app downloads, their phone may require them to verify saved payment  information or Apple/Android password.  - The patient will need to then log into the app with their MyChart username and password, and select [facility] as their healthcare provider to link the account. When it is time for your visit, go to the MyChart app, find appointments, and click Begin Video Visit. Be sure to Select Allow for your device to access the Microphone and Camera for your visit. You will then be connected, and your provider will be with you shortly.  **If they have any issues connecting, or need assistance please contact MyChart service desk [phone])**  **If using a computer, in order to ensure the best quality for their visit they will need to use either of the following Internet Browsers: [employer], or Google Chrome**  IF USING DOXIMITY or DOXY.ME - The patient will receive a link just prior to their visit by text.     FULL LENGTH CONSENT FOR TELE-HEALTH VISIT   I hereby voluntarily request, consent and authorize [facility] and its employed or contracted physicians, physician assistants, nurse practitioners or other licensed health care professionals (the Practitioner), to provide me with telemedicine health care services (the "Services") as deemed necessary by the treating Practitioner. I acknowledge and consent to receive the Services by the Practitioner via telemedicine. I understand that the telemedicine visit will involve communicating with the Practitioner through live audiovisual communication technology and the disclosure of certain medical information by electronic transmission. I acknowledge that I have been given the opportunity to request an in-person assessment or other available alternative prior to the telemedicine visit and am voluntarily participating in the telemedicine visit.  I understand that I have the right to withhold or withdraw my consent to the use of telemedicine in the course of my care at any time, without affecting my right  to future care or treatment, and that the Practitioner or I may terminate the telemedicine visit at any time. I understand that I have the right to inspect all information obtained and/or recorded in the course of the telemedicine visit and may receive copies of available information for a reasonable fee.  I understand that some of the potential risks of receiving the Services via telemedicine include:  Marland Kitchen Delay or interruption in medical evaluation due to technological equipment failure or disruption; . Information transmitted may not be sufficient (e.g. poor resolution of images) to allow for appropriate medical decision making by the Practitioner; and/or  . In rare instances, security protocols could fail, causing a breach of personal health information.  Furthermore, I acknowledge that it is my responsibility to provide information about my medical history, conditions and care that is complete and accurate to the best of my ability. I acknowledge that Practitioner's advice, recommendations, and/or decision may be based on factors not within their control, such as incomplete or inaccurate data provided by me or distortions of diagnostic images or specimens that may result from  electronic transmissions. I understand that the practice of medicine is not an exact science and that Practitioner makes no warranties or guarantees regarding treatment outcomes. I acknowledge that I will receive a copy of this consent concurrently upon execution via email to the email address I last provided but may also request a printed copy by calling the office of [facility].    I understand that my insurance will be billed for this visit.   I have read or had this consent read to me. . I understand the contents of this consent, which adequately explains the benefits and risks of the Services being provided via telemedicine.  . I have been provided ample opportunity to ask questions regarding this consent and the Services  and have had my questions answered to my satisfaction. . I give my informed consent for the services to be provided through the use of telemedicine in my medical care  By participating in this telemedicine visit I agree to the above.

## 2019-05-15 NOTE — Telephone Encounter (Signed)
Requested Prescriptions   Signed Prescriptions Disp Refills  . midodrine (PROAMATINE) 5 MG tablet 270 tablet 0    Sig: Take 1 tablet (5 mg total) by mouth 3 (three) times daily with meals.    Authorizing Provider: Minna Merritts    Ordering User: Raelene Bott, Domingue Coltrain L

## 2019-06-23 ENCOUNTER — Telehealth: Payer: PPO | Admitting: Cardiovascular Disease

## 2019-06-30 DIAGNOSIS — J449 Chronic obstructive pulmonary disease, unspecified: Secondary | ICD-10-CM | POA: Diagnosis not present

## 2019-06-30 DIAGNOSIS — F0150 Vascular dementia without behavioral disturbance: Secondary | ICD-10-CM | POA: Diagnosis not present

## 2019-06-30 DIAGNOSIS — Z8673 Personal history of transient ischemic attack (TIA), and cerebral infarction without residual deficits: Secondary | ICD-10-CM | POA: Diagnosis not present

## 2019-06-30 DIAGNOSIS — I2510 Atherosclerotic heart disease of native coronary artery without angina pectoris: Secondary | ICD-10-CM | POA: Diagnosis not present

## 2019-10-09 DIAGNOSIS — I69951 Hemiplegia and hemiparesis following unspecified cerebrovascular disease affecting right dominant side: Secondary | ICD-10-CM | POA: Diagnosis not present

## 2019-10-09 DIAGNOSIS — Z79899 Other long term (current) drug therapy: Secondary | ICD-10-CM | POA: Diagnosis not present

## 2019-10-09 DIAGNOSIS — Z8619 Personal history of other infectious and parasitic diseases: Secondary | ICD-10-CM | POA: Diagnosis not present

## 2019-10-09 DIAGNOSIS — I10 Essential (primary) hypertension: Secondary | ICD-10-CM | POA: Diagnosis not present

## 2019-10-09 DIAGNOSIS — Z681 Body mass index (BMI) 19 or less, adult: Secondary | ICD-10-CM | POA: Diagnosis not present

## 2019-10-09 DIAGNOSIS — J45909 Unspecified asthma, uncomplicated: Secondary | ICD-10-CM | POA: Diagnosis not present

## 2019-10-09 DIAGNOSIS — Z20822 Contact with and (suspected) exposure to covid-19: Secondary | ICD-10-CM | POA: Diagnosis not present

## 2019-10-09 DIAGNOSIS — R531 Weakness: Secondary | ICD-10-CM | POA: Diagnosis not present

## 2019-10-09 DIAGNOSIS — R4182 Altered mental status, unspecified: Secondary | ICD-10-CM | POA: Diagnosis not present

## 2019-10-09 DIAGNOSIS — R4701 Aphasia: Secondary | ICD-10-CM | POA: Diagnosis not present

## 2019-10-09 DIAGNOSIS — A0471 Enterocolitis due to Clostridium difficile, recurrent: Secondary | ICD-10-CM | POA: Diagnosis not present

## 2019-10-09 DIAGNOSIS — E876 Hypokalemia: Secondary | ICD-10-CM | POA: Diagnosis not present

## 2019-10-09 DIAGNOSIS — R109 Unspecified abdominal pain: Secondary | ICD-10-CM | POA: Diagnosis not present

## 2019-10-09 DIAGNOSIS — R1033 Periumbilical pain: Secondary | ICD-10-CM | POA: Diagnosis not present

## 2019-10-09 DIAGNOSIS — R64 Cachexia: Secondary | ICD-10-CM | POA: Diagnosis not present

## 2019-10-09 DIAGNOSIS — K5730 Diverticulosis of large intestine without perforation or abscess without bleeding: Secondary | ICD-10-CM | POA: Diagnosis not present

## 2019-10-09 DIAGNOSIS — D72829 Elevated white blood cell count, unspecified: Secondary | ICD-10-CM | POA: Diagnosis not present

## 2019-10-09 DIAGNOSIS — I69920 Aphasia following unspecified cerebrovascular disease: Secondary | ICD-10-CM | POA: Diagnosis not present

## 2019-10-09 DIAGNOSIS — A0472 Enterocolitis due to Clostridium difficile, not specified as recurrent: Secondary | ICD-10-CM | POA: Diagnosis not present

## 2020-02-06 DIAGNOSIS — I10 Essential (primary) hypertension: Secondary | ICD-10-CM | POA: Diagnosis not present

## 2020-02-06 DIAGNOSIS — J45909 Unspecified asthma, uncomplicated: Secondary | ICD-10-CM | POA: Diagnosis not present

## 2020-02-06 DIAGNOSIS — K219 Gastro-esophageal reflux disease without esophagitis: Secondary | ICD-10-CM | POA: Diagnosis not present

## 2020-02-06 DIAGNOSIS — R1310 Dysphagia, unspecified: Secondary | ICD-10-CM | POA: Diagnosis not present

## 2020-02-06 DIAGNOSIS — J309 Allergic rhinitis, unspecified: Secondary | ICD-10-CM | POA: Diagnosis not present

## 2020-02-26 DIAGNOSIS — I10 Essential (primary) hypertension: Secondary | ICD-10-CM | POA: Diagnosis not present

## 2020-02-26 DIAGNOSIS — I071 Rheumatic tricuspid insufficiency: Secondary | ICD-10-CM | POA: Diagnosis not present

## 2020-02-26 DIAGNOSIS — R1310 Dysphagia, unspecified: Secondary | ICD-10-CM | POA: Diagnosis not present

## 2020-02-26 DIAGNOSIS — R6889 Other general symptoms and signs: Secondary | ICD-10-CM | POA: Diagnosis not present

## 2020-02-26 DIAGNOSIS — I272 Pulmonary hypertension, unspecified: Secondary | ICD-10-CM | POA: Diagnosis not present

## 2020-03-11 ENCOUNTER — Telehealth: Payer: Self-pay | Admitting: Cardiovascular Disease

## 2020-03-11 NOTE — Telephone Encounter (Signed)
    Herbie [place] is calling, he said [place] is [place] and is having heart issue, he requested if pt's medical records can be fax to [phone]

## 2020-03-12 DIAGNOSIS — I272 Pulmonary hypertension, unspecified: Secondary | ICD-10-CM | POA: Diagnosis not present

## 2020-03-12 DIAGNOSIS — E782 Mixed hyperlipidemia: Secondary | ICD-10-CM | POA: Diagnosis not present

## 2020-03-12 DIAGNOSIS — J439 Emphysema, unspecified: Secondary | ICD-10-CM | POA: Diagnosis not present

## 2020-03-12 DIAGNOSIS — I255 Ischemic cardiomyopathy: Secondary | ICD-10-CM | POA: Diagnosis not present

## 2020-03-12 DIAGNOSIS — I071 Rheumatic tricuspid insufficiency: Secondary | ICD-10-CM | POA: Diagnosis not present

## 2020-03-12 DIAGNOSIS — I5020 Unspecified systolic (congestive) heart failure: Secondary | ICD-10-CM | POA: Diagnosis not present

## 2020-03-12 DIAGNOSIS — I340 Nonrheumatic mitral (valve) insufficiency: Secondary | ICD-10-CM | POA: Diagnosis not present

## 2020-03-12 DIAGNOSIS — I351 Nonrheumatic aortic (valve) insufficiency: Secondary | ICD-10-CM | POA: Diagnosis not present

## 2020-03-12 DIAGNOSIS — I10 Essential (primary) hypertension: Secondary | ICD-10-CM | POA: Diagnosis not present

## 2020-03-12 DIAGNOSIS — I252 Old myocardial infarction: Secondary | ICD-10-CM | POA: Diagnosis not present

## 2020-03-12 DIAGNOSIS — R0600 Dyspnea, unspecified: Secondary | ICD-10-CM | POA: Diagnosis not present

## 2020-03-17 NOTE — Telephone Encounter (Signed)
[facility] did not receive medical records via epic release.    Printed ov notes ekgs and echo faxed to [phone] confirmed fax.

## 2020-05-04 DIAGNOSIS — Z0000 Encounter for general adult medical examination without abnormal findings: Secondary | ICD-10-CM | POA: Diagnosis not present

## 2020-05-04 DIAGNOSIS — J449 Chronic obstructive pulmonary disease, unspecified: Secondary | ICD-10-CM | POA: Diagnosis not present

## 2020-05-04 DIAGNOSIS — Z8673 Personal history of transient ischemic attack (TIA), and cerebral infarction without residual deficits: Secondary | ICD-10-CM | POA: Diagnosis not present

## 2020-05-04 DIAGNOSIS — I5022 Chronic systolic (congestive) heart failure: Secondary | ICD-10-CM | POA: Diagnosis not present

## 2020-05-04 DIAGNOSIS — M810 Age-related osteoporosis without current pathological fracture: Secondary | ICD-10-CM | POA: Diagnosis not present

## 2020-05-04 DIAGNOSIS — F0150 Vascular dementia without behavioral disturbance: Secondary | ICD-10-CM | POA: Diagnosis not present

## 2020-06-11 DIAGNOSIS — F0150 Vascular dementia without behavioral disturbance: Secondary | ICD-10-CM | POA: Diagnosis not present

## 2020-06-11 DIAGNOSIS — M545 Low back pain, unspecified: Secondary | ICD-10-CM | POA: Diagnosis not present

## 2020-06-11 DIAGNOSIS — M25551 Pain in right hip: Secondary | ICD-10-CM | POA: Diagnosis not present

## 2020-06-11 DIAGNOSIS — M810 Age-related osteoporosis without current pathological fracture: Secondary | ICD-10-CM | POA: Diagnosis not present

## 2020-08-09 NOTE — Progress Notes (Signed)
Cardiology Office Note  Date:  08/10/2020   ID:  [name], DOB 1925-04-03, MRN [id number]  PCP:  Tracie Harrier, MD   Chief Complaint  Patient presents with  . 12 month follow up     "doing well." Medications reviewed by daughter Juliann Pulse).     HPI:  Ms. Dana Austin is a 87 yo old woman with history of  COPD on chronic prednisone,  stroke,  paroxysmal atrial fibrillation,  prior falls with subdural hematoma,  presented to the hospital 02/2018 with respiratory distress, markedly abnormal EKG concerning for ischemia, cardiac catheterization declined by interventional team given her frail state Echo showing EF 20% Troponin peak of 2.64  DNR/DNI  LOV 03/2018 At that time she was taking midodrine for blood pressure support Significant leg weakness Cardiomyopathy noted, invasive studies declined, EF 20%  In follow-up today she presents with her daughter Treated with antibiotics for upper respiratory infection primary care Developed C. difficile infection May 2021, long hospitalization, very sick [place] state  Was cough when eating Family took her to pulmonary, and cardiology Echo out of state: EF 20% Would appear there was little change from prior echocardiogram  Currently lives with sister,  6 months in [place] in 6 months locally  EKG personally reviewed by myself on todays visit Shows normal sinus rhythm rate 71 bpm old anterior MI Baseline  Records reviewed with them in detail Echocardiogram - Left ventricle: The cavity size was severely dilated. Systolic function was severely reduced. The estimated ejection fraction was less than 20% Severe global akinesis with basal wall motion best preserved. LV appears dilated and globular, possible regions of aneurysm. The study is not technically sufficient to allow evaluation of LV diastolic function. - Aortic valve: There was mild regurgitation. - Left atrium: The atrium was normal in size. - Right  ventricle: Systolic function was normal. - Pulmonary arteries: Systolic pressure was within the normal range.   PMH:   has a past medical history of Anxiety, Asthma, COPD (chronic obstructive pulmonary disease) ([place]), Depression, Essential hypertension, GERD (gastroesophageal reflux disease), History of cardiac cath, History of echocardiogram, History of ischemic left MCA stroke, Hyperlipidemia, Insomnia, Lower extremity cellulitis, Onychomycosis, PAF (paroxysmal atrial fibrillation) ([place]), Skin cancer, Subdural hematoma ([place]), Syncope, and Vitamin D deficiency.  PSH:    Past Surgical History:  Procedure Laterality Date  . none      Current Outpatient Medications  Medication Sig Dispense Refill  . albuterol (VENTOLIN HFA) 108 (90 Base) MCG/ACT inhaler Inhale into the lungs.    . ALPRAZolam (XANAX) 0.25 MG tablet Take 0.25 mg by mouth at bedtime as needed for anxiety.    Marland Kitchen atorvastatin (LIPITOR) 10 MG tablet Take 10 mg by mouth daily.    . budesonide-formoterol (SYMBICORT) 160-4.5 MCG/ACT inhaler Inhale 2 puffs into the lungs 2 (two) times daily.    . Cholecalciferol 25 MCG (1000 UT) capsule Take by mouth.    . ENTRESTO 24-26 MG Take 0.5 tablets by mouth 2 (two) times daily.    . fluticasone (FLONASE) 50 MCG/ACT nasal spray Place 1 spray into both nostrils daily.    . furosemide (LASIX) 20 MG tablet Take 1 tablet (20 mg total) by mouth daily as needed. 90 tablet 3  . ipratropium-albuterol (DUONEB) 0.5-2.5 (3) MG/3ML SOLN USE 3 ML VIA NEBULIZER FOUR TIMES DAILY    . ipratropium-albuterol (DUONEB) 0.5-2.5 (3) MG/3ML SOLN Take 3 mLs by nebulization 4 (four) times daily.    . metoprolol succinate (TOPROL-XL) 25 MG 24  hr tablet Take 25 mg by mouth daily.    . montelukast (SINGULAIR) 10 MG tablet Take 10 mg by mouth daily.    . Multiple Vitamin (MULTIVITAMIN WITH MINERALS) TABS tablet Take 1 tablet by mouth daily.    . Multiple Vitamins-Minerals (PRESERVISION AREDS 2 PO) Take by mouth daily.     Marland Kitchen omeprazole (PRILOSEC) 20 MG capsule Take by mouth.    Marland Kitchen PARoxetine (PAXIL) 10 MG tablet Take 10 mg by mouth daily.     . predniSONE (DELTASONE) 2.5 MG tablet Take 2.5 mg by mouth daily with breakfast.     No current facility-administered medications for this visit.     Allergies:   Meloxicam   Social History:  The patient  reports that she has never smoked. She has never used smokeless tobacco. She reports that she does not drink alcohol and does not use drugs.   Family History:   family history includes CAD in her brother; Diabetes Mellitus II in her mother.    Review of Systems: Review of Systems  Constitutional: Negative.   HENT: Negative.   Respiratory: Negative.   Cardiovascular: Negative.   Gastrointestinal: Negative.   Musculoskeletal:       Leg weakness  Neurological: Negative.   Psychiatric/Behavioral: Negative.   All other systems reviewed and are negative.    PHYSICAL EXAM: VS:  BP 102/60 (BP Location: Left Arm, Patient Position: Sitting, Cuff Size: Normal)   Pulse 71   Ht 5\' 2"  (1.575 m)   Wt 86 lb 8 oz (39.2 kg)   SpO2 98%   BMI 15.82 kg/m  , BMI Body mass index is 15.82 kg/m. Constitutional:  oriented to person, place, and time. No distress.  Very thin HENT:  Head: Grossly normal Eyes:  no discharge. No scleral icterus.  Neck: No JVD, no carotid bruits  Cardiovascular: Regular rate and rhythm, no murmurs appreciated Pulmonary/Chest: Clear to auscultation bilaterally, no wheezes or rails Abdominal: Soft.  no distension.  no tenderness.  Musculoskeletal: Normal range of motion Neurological:  normal muscle tone. Coordination normal. No atrophy Skin: Skin warm and dry Psychiatric: normal affect, pleasant  Recent Labs: No results found for requested labs within last 8760 hours.    Lipid Panel Lab Results  Component Value Date   CHOL 136 02/02/2018   HDL 68 02/02/2018   LDLCALC 62 02/02/2018   TRIG 32 02/02/2018     Wt Readings from Last 3  Encounters:  08/10/20 86 lb 8 oz (39.2 kg)  03/05/18 92 lb 12 oz (42.1 kg)  02/05/18 105 lb (47.6 kg)     ASSESSMENT AND PLAN:  Centrilobular emphysema (HCC) diagnosis of COPD, no significant smoking history in the past  Severe protein calorie malnutrition Weight down from 105 pounds in 2019, now 86 pounds Encouraged high carbohydrate foods, snacking, discussed with daughter who presents with her  Dilated cardiomyopathy ([place]) - Plan: EKG 12-Lead As on prior clinic visit in 2019, Unable to add further medications for heart failure given low blood pressure Lasix as needed Appears euvolemic, she appears comfortable, We have refilled her Entresto 0.5 twie daily of the 24/26 mg pill Not a good candidate for digoxin Systolic blood pressure 831 on today's visit  Acute on chronic combined systolic and diastolic CHF (congestive heart failure) ([place]) Long discussion concerning watching her weight, looking for signs of heart failure and taking Lasix appropriately Likely will not need to take this daily given her low fluid intake  Coronary artery disease involving native coronary  artery of native heart with unstable angina pectoris ([place]) - Prior evaluation 2019, cardiac catheterization deferred Suspected ischemic cardiomyopathy On prior clinic visit she was on aspirin, statin She is on Lipitor 10 mg daily   Paroxysmal atrial fibrillation ([place]) - Plan: EKG 12-Lead Maintaining normal sinus rhythm   Total encounter time more than 25 minutes  Greater than 50% was spent in counseling and coordination of care with the patient    No orders of the defined types were placed in this encounter.    Signed, Esmond Plants, M.D., Ph.D. 08/10/2020  [place], [place]

## 2020-08-10 ENCOUNTER — Encounter: Payer: Self-pay | Admitting: Cardiovascular Disease

## 2020-08-10 ENCOUNTER — Ambulatory Visit: Payer: PPO | Admitting: Cardiovascular Disease

## 2020-08-10 ENCOUNTER — Other Ambulatory Visit: Payer: Self-pay

## 2020-08-10 VITALS — BP 102/60 | HR 71 | Ht 62.0 in | Wt 86.5 lb

## 2020-08-10 DIAGNOSIS — I25118 Atherosclerotic heart disease of native coronary artery with other forms of angina pectoris: Secondary | ICD-10-CM | POA: Diagnosis not present

## 2020-08-10 DIAGNOSIS — I5043 Acute on chronic combined systolic (congestive) and diastolic (congestive) heart failure: Secondary | ICD-10-CM | POA: Diagnosis not present

## 2020-08-10 DIAGNOSIS — I420 Dilated cardiomyopathy: Secondary | ICD-10-CM

## 2020-08-10 MED ORDER — ENTRESTO 24-26 MG PO TABS: 0.5000 | ORAL_TABLET | Freq: Two times a day (BID) | ORAL | 3 refills | Status: AC

## 2020-08-10 NOTE — Patient Instructions (Addendum)
Medication Instructions:  No changes  If you need a refill on your cardiac medications before your next appointment, please call your pharmacy.    Lab work: No new labs needed   If you have labs (blood work) drawn today and your tests are completely normal, you will receive your results only by: Marland Kitchen MyChart Message (if you have MyChart) OR . A paper copy in the mail If you have any lab test that is abnormal or we need to change your treatment, we will call you to review the results.   Testing/Procedures: No new testing needed   Follow-Up: At [facility], you and your health needs are our priority.  As part of our continuing mission to provide you with exceptional heart care, we have created designated Provider Care Teams.  These Care Teams include your primary Cardiologist (physician) and Advanced Practice Providers (APPs -  Physician Assistants and Nurse Practitioners) who all work together to provide you with the care you need, when you need it.  . You will need a follow up appointment in 6-12 months  . Providers on your designated Care Team:   . Murray Hodgkins, NP . Christell Faith, PA-C . Marrianne Mood, PA-C  Any Other Special Instructions Will Be Listed Below (If Applicable).  COVID-19 Vaccine Information can be found at: [email or web address].uk For questions related to vaccine distribution or appointments, please email [email or web address] or call [phone].

## 2020-09-14 DIAGNOSIS — F0150 Vascular dementia without behavioral disturbance: Secondary | ICD-10-CM | POA: Diagnosis not present

## 2020-09-14 DIAGNOSIS — I5022 Chronic systolic (congestive) heart failure: Secondary | ICD-10-CM | POA: Diagnosis not present

## 2020-09-14 DIAGNOSIS — J449 Chronic obstructive pulmonary disease, unspecified: Secondary | ICD-10-CM | POA: Diagnosis not present

## 2020-09-14 DIAGNOSIS — Z8673 Personal history of transient ischemic attack (TIA), and cerebral infarction without residual deficits: Secondary | ICD-10-CM | POA: Diagnosis not present

## 2020-09-14 DIAGNOSIS — M810 Age-related osteoporosis without current pathological fracture: Secondary | ICD-10-CM | POA: Diagnosis not present

## 2020-09-24 DIAGNOSIS — Z111 Encounter for screening for respiratory tuberculosis: Secondary | ICD-10-CM | POA: Diagnosis not present

## 2020-11-02 DIAGNOSIS — I2510 Atherosclerotic heart disease of native coronary artery without angina pectoris: Secondary | ICD-10-CM | POA: Diagnosis not present

## 2020-11-02 DIAGNOSIS — F0150 Vascular dementia without behavioral disturbance: Secondary | ICD-10-CM | POA: Diagnosis not present

## 2020-11-02 DIAGNOSIS — M810 Age-related osteoporosis without current pathological fracture: Secondary | ICD-10-CM | POA: Diagnosis not present

## 2020-11-02 DIAGNOSIS — R8290 Unspecified abnormal findings in urine: Secondary | ICD-10-CM | POA: Diagnosis not present

## 2020-11-02 DIAGNOSIS — E441 Mild protein-calorie malnutrition: Secondary | ICD-10-CM | POA: Diagnosis not present

## 2020-11-02 DIAGNOSIS — Z8673 Personal history of transient ischemic attack (TIA), and cerebral infarction without residual deficits: Secondary | ICD-10-CM | POA: Diagnosis not present

## 2020-11-02 DIAGNOSIS — J449 Chronic obstructive pulmonary disease, unspecified: Secondary | ICD-10-CM | POA: Diagnosis not present

## 2020-11-02 DIAGNOSIS — Z0000 Encounter for general adult medical examination without abnormal findings: Secondary | ICD-10-CM | POA: Diagnosis not present

## 2020-11-03 DIAGNOSIS — R8290 Unspecified abnormal findings in urine: Secondary | ICD-10-CM | POA: Diagnosis not present

## 2021-02-13 NOTE — Progress Notes (Signed)
Cardiology Office Note  Date:  02/14/2021   ID:  Dana Austin, DOB 04-19-1925, MRN [id number]  PCP:  Tracie Harrier, MD   Chief Complaint  Patient presents with   6 month follow up     "Doing well." Medications reviewed by the patient verbally.     HPI:  Dana Austin is a 87 yo old woman with history of  COPD on chronic prednisone,  stroke,  paroxysmal atrial fibrillation,  prior falls with subdural hematoma,  presented to the hospital 02/2018 with respiratory distress, markedly abnormal EKG concerning for ischemia, cardiac catheterization declined by interventional team given her frail state Echo showing EF 20% Troponin peak of 2.64  DNR/DNI  LOV 08/2020 In a assisted living facility in [place] today with her daughter  Weight stable, trending upwards, reports good appetite, likes the food at the nursing facility Followed by hospice who walks with her Also family goes to visit  Currently not on midodrine Daughter reports hospice is checking blood pressure, numbers not available Tolerating low-dose Entresto cut in half twice a day, metoprolol succinate 25  No recent echocardiogram or cardiac studies  EKG personally reviewed by myself on todays visit Baseline artifact, normal sinus rhythm rate 79 bpm consider old anterior MI  Prior history Treated with antibiotics for upper respiratory infection primary care Developed C. difficile infection May 2021, long hospitalization, very sick [place]   Records reviewed with them in detail Echocardiogram - Left ventricle: The cavity size was severely dilated. Systolic   function was severely reduced. The estimated ejection fraction   was less than 20% Severe global akinesis with basal wall motion   best preserved. LV appears dilated and globular, possible regions   of aneurysm. The study is not technically sufficient to allow   evaluation of LV diastolic function. - Aortic valve: There was mild  regurgitation. - Left atrium: The atrium was normal in size. - Right ventricle: Systolic function was normal. - Pulmonary arteries: Systolic pressure was within the normal   range.   PMH:   has a past medical history of Anxiety, Asthma, COPD (chronic obstructive pulmonary disease) ([place]), Depression, Essential hypertension, GERD (gastroesophageal reflux disease), History of cardiac cath, History of echocardiogram, History of ischemic left MCA stroke, Hyperlipidemia, Insomnia, Lower extremity cellulitis, Onychomycosis, PAF (paroxysmal atrial fibrillation) ([place]), Skin cancer, Subdural hematoma ([place]), Syncope, and Vitamin D deficiency.  PSH:    Past Surgical History:  Procedure Laterality Date   none      Current Outpatient Medications  Medication Sig Dispense Refill   albuterol (VENTOLIN HFA) 108 (90 Base) MCG/ACT inhaler Inhale into the lungs.     ALPRAZolam (XANAX) 0.25 MG tablet Take 0.25 mg by mouth at bedtime as needed for anxiety.     atorvastatin (LIPITOR) 10 MG tablet Take 10 mg by mouth daily.     budesonide-formoterol (SYMBICORT) 160-4.5 MCG/ACT inhaler Inhale 2 puffs into the lungs 2 (two) times daily.     Cholecalciferol 25 MCG (1000 UT) capsule Take by mouth.     ENTRESTO 24-26 MG Take 0.5 tablets by mouth 2 (two) times daily. 90 tablet 3   fluticasone (FLONASE) 50 MCG/ACT nasal spray Place 1 spray into both nostrils daily.     furosemide (LASIX) 20 MG tablet Take 1 tablet (20 mg total) by mouth daily as needed. 90 tablet 3   ipratropium-albuterol (DUONEB) 0.5-2.5 (3) MG/3ML SOLN USE 3 ML VIA NEBULIZER FOUR TIMES DAILY     ipratropium-albuterol (DUONEB) 0.5-2.5 (3)  MG/3ML SOLN Take 3 mLs by nebulization 4 (four) times daily.     metoprolol succinate (TOPROL-XL) 25 MG 24 hr tablet Take 25 mg by mouth daily.     montelukast (SINGULAIR) 10 MG tablet Take 10 mg by mouth daily.     Multiple Vitamin (MULTIVITAMIN WITH MINERALS) TABS tablet Take 1 tablet by mouth daily.     Multiple  Vitamins-Minerals (PRESERVISION AREDS 2 PO) Take by mouth daily.     omeprazole (PRILOSEC) 20 MG capsule Take by mouth.     PARoxetine (PAXIL) 10 MG tablet Take 10 mg by mouth daily.      predniSONE (DELTASONE) 2.5 MG tablet Take 2.5 mg by mouth daily with breakfast.     No current facility-administered medications for this visit.     Allergies:   Meloxicam   Social History:  The patient  reports that she has never smoked. She has never used smokeless tobacco. She reports that she does not drink alcohol and does not use drugs.   Family History:   family history includes CAD in her brother; Diabetes Mellitus II in her mother.    Review of Systems: Review of Systems  Constitutional: Negative.   HENT: Negative.    Respiratory: Negative.    Cardiovascular: Negative.   Gastrointestinal: Negative.   Musculoskeletal:        Leg weakness  Neurological: Negative.   Psychiatric/Behavioral: Negative.    All other systems reviewed and are negative.   PHYSICAL EXAM: VS:  BP 100/60 (BP Location: Left Arm, Patient Position: Sitting, Cuff Size: Normal)   Pulse 79   Ht 5' 2.5" (1.588 m)   Wt 98 lb (44.5 kg)   SpO2 93%   BMI 17.64 kg/m  , BMI Body mass index is 17.64 kg/m. Constitutional:  oriented to person, place, and time. No distress.  HENT:  Head: Grossly normal Eyes:  no discharge. No scleral icterus.  Neck: No JVD, no carotid bruits  Cardiovascular: Regular rate and rhythm, no murmurs appreciated Pulmonary/Chest: Clear to auscultation bilaterally, no wheezes or rails Abdominal: Soft.  no distension.  no tenderness.  Musculoskeletal: Normal range of motion Neurological:  normal muscle tone. Coordination normal. No atrophy Skin: Skin warm and dry Psychiatric: normal affect, pleasant   Recent Labs: No results found for requested labs within last 8760 hours.    Lipid Panel Lab Results  Component Value Date   CHOL 136 02/02/2018   HDL 68 02/02/2018   LDLCALC 62  02/02/2018   TRIG 32 02/02/2018     Wt Readings from Last 3 Encounters:  02/14/21 98 lb (44.5 kg)  08/10/20 86 lb 8 oz (39.2 kg)  03/05/18 92 lb 12 oz (42.1 kg)     ASSESSMENT AND PLAN:  Centrilobular emphysema (HCC) diagnosis of COPD, no significant smoking history in the past  Severe protein calorie malnutrition Weight stable, trending upwards, good appetite  Dilated cardiomyopathy ([place]) - Plan: EKG 12-Lead Blood pressure elevated today, this is well above her normal We have requested family help to obtain blood pressure measurements from hospice For now we will continue Entresto 0.5 twie daily of the 24/26 mg pill Continue metoprolol  Acute on chronic combined systolic and diastolic CHF (congestive heart failure) ([place]) Appears euvolemic on today's visit  Coronary artery disease involving native coronary artery of native heart with unstable angina pectoris ([place]) - Prior evaluation 2019, cardiac catheterization deferred Suspected ischemic cardiomyopathy Continue aspirin statin beta-blocker Entresto  Paroxysmal atrial fibrillation ([place]) - Plan: EKG 12-Lead Maintaining  normal sinus rhythm   Total encounter time more than 25 minutes  Greater than 50% was spent in counseling and coordination of care with the patient    Orders Placed This Encounter  Procedures   EKG 12-Lead      Signed, Esmond Plants, M.D., Ph.D. 02/14/2021  [place], [place]

## 2021-02-14 ENCOUNTER — Other Ambulatory Visit: Payer: Self-pay

## 2021-02-14 ENCOUNTER — Encounter: Payer: Self-pay | Admitting: Cardiovascular Disease

## 2021-02-14 ENCOUNTER — Ambulatory Visit: Admitting: Cardiovascular Disease

## 2021-02-14 VITALS — BP 100/60 | HR 79 | Ht 62.5 in | Wt 98.0 lb

## 2021-02-14 DIAGNOSIS — I25118 Atherosclerotic heart disease of native coronary artery with other forms of angina pectoris: Secondary | ICD-10-CM | POA: Diagnosis not present

## 2021-02-14 DIAGNOSIS — I420 Dilated cardiomyopathy: Secondary | ICD-10-CM

## 2021-02-14 DIAGNOSIS — I5043 Acute on chronic combined systolic (congestive) and diastolic (congestive) heart failure: Secondary | ICD-10-CM

## 2021-02-14 NOTE — Patient Instructions (Addendum)
Blood pressures from hospice, have uploaded to myChart, call in with numbers, or they may fax them in [phone]  Medication Instructions:  No changes  If you need a refill on your cardiac medications before your next appointment, please call your pharmacy.   Lab work: No new labs needed  Testing/Procedures: No new testing needed  Follow-Up: At [facility], you and your health needs are our priority.  As part of our continuing mission to provide you with exceptional heart care, we have created designated Provider Care Teams.  These Care Teams include your primary Cardiologist (physician) and Advanced Practice Providers (APPs -  Physician Assistants and Nurse Practitioners) who all work together to provide you with the care you need, when you need it.  You will need a follow up appointment in 12 months  Providers on your designated Care Team:   Murray Hodgkins, NP Christell Faith, PA-C Marrianne Mood, PA-C Cadence McClure, [place]  COVID-19 Vaccine Information can be found at: [email or web address] For questions related to vaccine distribution or appointments, please email [email or web address] or call [phone].
